# Patient Record
Sex: Female | Born: 1985
Health system: Southern US, Community
[De-identification: ages and names within clinical notes are randomized; demographics above are authoritative.]

## PROBLEM LIST (undated history)

## (undated) ENCOUNTER — Inpatient Hospital Stay (HOSPITAL_COMMUNITY): Payer: Self-pay

## (undated) DIAGNOSIS — L409 Psoriasis, unspecified: Secondary | ICD-10-CM

## (undated) DIAGNOSIS — Z309 Encounter for contraceptive management, unspecified: Secondary | ICD-10-CM

## (undated) DIAGNOSIS — L309 Dermatitis, unspecified: Secondary | ICD-10-CM

## (undated) DIAGNOSIS — R87629 Unspecified abnormal cytological findings in specimens from vagina: Secondary | ICD-10-CM

## (undated) DIAGNOSIS — N760 Acute vaginitis: Secondary | ICD-10-CM

## (undated) DIAGNOSIS — Z113 Encounter for screening for infections with a predominantly sexual mode of transmission: Secondary | ICD-10-CM

## (undated) DIAGNOSIS — IMO0002 Reserved for concepts with insufficient information to code with codable children: Secondary | ICD-10-CM

## (undated) DIAGNOSIS — R87619 Unspecified abnormal cytological findings in specimens from cervix uteri: Secondary | ICD-10-CM

## (undated) DIAGNOSIS — R51 Headache: Secondary | ICD-10-CM

## (undated) DIAGNOSIS — N898 Other specified noninflammatory disorders of vagina: Secondary | ICD-10-CM

## (undated) DIAGNOSIS — B9689 Other specified bacterial agents as the cause of diseases classified elsewhere: Secondary | ICD-10-CM

## (undated) HISTORY — DX: Encounter for contraceptive management, unspecified: Z30.9

## (undated) HISTORY — PX: NO PAST SURGERIES: SHX2092

## (undated) HISTORY — DX: Other specified noninflammatory disorders of vagina: N89.8

## (undated) HISTORY — DX: Encounter for screening for infections with a predominantly sexual mode of transmission: Z11.3

## (undated) HISTORY — DX: Psoriasis, unspecified: L40.9

## (undated) HISTORY — DX: Dermatitis, unspecified: L30.9

## (undated) HISTORY — DX: Acute vaginitis: N76.0

## (undated) HISTORY — PX: COLPOSCOPY W/ BIOPSY / CURETTAGE: SUR283

## (undated) HISTORY — DX: Other specified bacterial agents as the cause of diseases classified elsewhere: B96.89

## (undated) HISTORY — DX: Headache: R51

## (undated) HISTORY — DX: Unspecified abnormal cytological findings in specimens from vagina: R87.629

---

## 2008-07-26 ENCOUNTER — Emergency Department (HOSPITAL_COMMUNITY): Admission: EM | Admit: 2008-07-26 | Discharge: 2008-07-26 | Payer: Self-pay | Admitting: Emergency Medicine

## 2009-02-28 ENCOUNTER — Emergency Department (HOSPITAL_COMMUNITY): Admission: EM | Admit: 2009-02-28 | Discharge: 2009-02-28 | Payer: Self-pay | Admitting: Emergency Medicine

## 2010-09-24 NOTE — Unmapped (Signed)
THE [facility]     PATIENT NAME:   Autumn Shepard, Autumn Shepard                 MR #:  [id number]   DATE OF BIRTH:  04-19-1986                        ACCOUNT #:  [id number]   ED PHYSICIAN:   Clementeen Hoof. Lowella Petties, M.D.          ROOM #:   PRIMARY:        Selected Referral Pt              NURSING UNIT:  ED   REFERRING:      Selected Referral Pt              FC:  D   DICTATED BY:    Clementeen Hoof. Lowella Petties, M.D.          ADMIT DATE:  09/24/2010   VISIT DATE:                                       DISCHARGE DATE:                               EMERGENCY DEPARTMENT NOTE     *-*-*     CHIEF COMPLAINT:  Tooth pain.     HISTORY OF PRESENT ILLNESS:  This is a 25 year old female who about a month   ago had wisdom teeth extracted in all four quadrants.  Has had no malodorous   taste in her mouth.  Has had persistent pain, worse in the left greater than   the right.  Her swelling is altogether resolved.  She has followed up with   her oral surgeon that extracted her teeth and was told that everything   appeared to be healing well.  She endorses continued pain.  She comes back in   because she was told that we have a dentist here at [facility].   Notes no fevers or chills, no inability to swallow secretions.  She has been   taking a lot of ibuprofen without relief.  She does endorse chronic low back   pain for which she has a pain patch.  She notes 10/10 pain in the left   greater than right mandibular region.     PAST MEDICAL HISTORY:     1.  Chronic back pain.   2.  Asthma.     MEDICATIONS:     1.  Paxil.   2.  Ibuprofen.   3.  Pain patch that she wears topically for her back pain.     REVIEW OF SYSTEMS:  No intentional weight loss, weight gain.  Positive for   back pain.     SOCIAL HISTORY:  She smokes one pack per day.  Does not drink or use any   other drugs.     FAMILY HISTORY:  Positive for hypertension.     PHYSICAL EXAMINATION:     VITAL SIGNS:  Blood pressure 122/86, heart rate 99, respiration  16,   temperature is 96.9, sat is 100% on room air.   GENERAL:  The patient is in no acute distress.   HEENT:  Normocephalic and atraumatic.  Pupils were equal, round, and reactive   to light.  Extraocular motion is intact.  Oropharynx is pink.  Mucous   membranes are moist.  Airway is widely patent.  Uvula is midline.  Tongue is   midline.  There is no submental swelling.  There is no evidence of purulent   material draining from any of the four extraction sites.  Floor of mouth is   soft.  No erythema is noted or swelling is noted past the angle of the   mandible.  TMs are clear.   NECK:  No stridor, no JVD, no bruit, no thyromegaly, and no nuchal rigidity.   CHEST:  Clear to auscultation and percussion.   HEART:  Regular rate and rhythm.   ABDOMEN:  Bowel sounds are positive.  Nontender and nondistended.  No   hepatosplenomegaly.   MUSCULOSKELETAL:  Joints have full range of motion.  There is no clubbing,   cyanosis, or edema.   LYMPHATIC EXAM:  No axillary or cervical lymphadenopathy.   SKIN EXAM:  Shows no petechial or purpuric rashes.   PSYCHIATRIC EXAM:  The patient is alert and oriented.   NEUROLOGIC:  Strength 5/5.     ASSESSMENT:     1.  Acute odontalgia, consider subclinical periostitis.     PLAN:     1.  The patient was given Penicillin VK and Ultram.   2.  Instructed to follow up with any of the local dental clinics, although     her options are limited given the fact that she is from [place].   3.  I have report her to go back to her oral surgeon to further explore the     etiology of her discomfort.     DISPOSITION:  She is discharged home.     CONDITION ON DISCHARGE:  Good.           *-*-*                                             _______________________________________   GJF/krm                                _____   D:  09/24/2010 07:42                  Clementeen Hoof. Lowella Petties, M.D.   T:  09/24/2010 14:30   Job #:  [id number]                                    EMERGENCY DEPARTMENT NOTE                                                                 PAGE    1 of   1

## 2012-02-12 LAB — OB RESULTS CONSOLE RUBELLA ANTIBODY, IGM: Rubella: IMMUNE

## 2012-02-12 LAB — OB RESULTS CONSOLE HIV ANTIBODY (ROUTINE TESTING): HIV: NONREACTIVE

## 2012-02-12 LAB — OB RESULTS CONSOLE VARICELLA ZOSTER ANTIBODY, IGG: Varicella: IMMUNE

## 2012-06-09 NOTE — L&D Delivery Note (Signed)
Delivery Note At 3:39 PM a viable and healthy female was delivered via Vaginal, Spontaneous Delivery (Presentation: Left Occiput Anterior).  APGAR: 8&9, weight: pending .   Placenta status: Intact, Spontaneous.  Cord: 3 vessels with the following complications: None.  Cord pH: n/a  Anesthesia: Epidural  Episiotomy: None Lacerations: None Est. Blood Loss (mL): 350  Mom to postpartum.  Baby to nursery-stable.  [facility] 09/08/2012, 4:13 PM

## 2012-06-11 LAB — OB RESULTS CONSOLE RPR: RPR: NONREACTIVE

## 2012-08-21 ENCOUNTER — Encounter: Payer: Self-pay | Admitting: *Deleted

## 2012-08-21 DIAGNOSIS — L409 Psoriasis, unspecified: Secondary | ICD-10-CM

## 2012-08-21 DIAGNOSIS — G43109 Migraine with aura, not intractable, without status migrainosus: Secondary | ICD-10-CM

## 2012-08-26 ENCOUNTER — Encounter: Payer: Self-pay | Admitting: Obstetrics & Gynecology

## 2012-08-26 ENCOUNTER — Ambulatory Visit (INDEPENDENT_AMBULATORY_CARE_PROVIDER_SITE_OTHER): Payer: Medicaid Other | Admitting: Obstetrics & Gynecology

## 2012-08-26 VITALS — BP 120/80 | Wt 183.5 lb

## 2012-08-26 DIAGNOSIS — Z3403 Encounter for supervision of normal first pregnancy, third trimester: Secondary | ICD-10-CM

## 2012-08-26 DIAGNOSIS — O289 Unspecified abnormal findings on antenatal screening of mother: Secondary | ICD-10-CM

## 2012-08-26 DIAGNOSIS — O283 Abnormal ultrasonic finding on antenatal screening of mother: Secondary | ICD-10-CM

## 2012-08-26 DIAGNOSIS — O358XX0 Maternal care for other (suspected) fetal abnormality and damage, not applicable or unspecified: Secondary | ICD-10-CM

## 2012-08-26 DIAGNOSIS — Z1389 Encounter for screening for other disorder: Secondary | ICD-10-CM

## 2012-08-26 DIAGNOSIS — Z331 Pregnant state, incidental: Secondary | ICD-10-CM

## 2012-08-26 LAB — POCT URINALYSIS DIPSTICK
Blood, UA: NEGATIVE
Glucose, UA: NEGATIVE
Nitrite, UA: NEGATIVE

## 2012-08-26 NOTE — Progress Notes (Signed)
Patient reports good fetal movement, denies any bleeding and no rupture of membranes symptoms or contraction.  She is without complaints.  All questions were answered.

## 2012-08-26 NOTE — Patient Instructions (Signed)
Pregnancy - Third Trimester  The third trimester of pregnancy (the last 3 months) is a period of the most rapid growth for you and your baby. The baby approaches a length of 20 inches and a weight of 6 to 10 pounds. The baby is adding on fat and getting ready for life outside your body. While inside, babies have periods of sleeping and waking, suck their thumbs, and hiccups. You can often feel small contractions of the uterus. This is false labor. It is also called Braxton-Hicks contractions. This is like a practice for labor. The usual problems in this stage of pregnancy include more difficulty breathing, swelling of the hands and feet from water retention, and having to urinate more often because of the uterus and baby pressing on your bladder.   PRENATAL EXAMS  · Blood work may continue to be done during prenatal exams. These tests are done to check on your health and the probable health of your baby. Blood work is used to follow your blood levels (hemoglobin). Anemia (low hemoglobin) is common during pregnancy. Iron and vitamins are given to help prevent this. You may also continue to be checked for diabetes. Some of the past blood tests may be done again.  · The size of the uterus is measured during each visit. This makes sure your baby is growing properly according to your pregnancy dates.  · Your blood pressure is checked every prenatal visit. This is to make sure you are not getting toxemia.  · Your urine is checked every prenatal visit for infection, diabetes and protein.  · Your weight is checked at each visit. This is done to make sure gains are happening at the suggested rate and that you and your baby are growing normally.  · Sometimes, an ultrasound is performed to confirm the position and the proper growth and development of the baby. This is a test done that bounces harmless sound waves off the baby so your caregiver can more accurately determine due dates.  · Discuss the type of pain medication and  anesthesia you will have during your labor and delivery.  · Discuss the possibility and anesthesia if a Cesarean Section might be necessary.  · Inform your caregiver if there is any mental or physical violence at home.  Sometimes, a specialized non-stress test, contraction stress test and biophysical profile are done to make sure the baby is not having a problem. Checking the amniotic fluid surrounding the baby is called an amniocentesis. The amniotic fluid is removed by sticking a needle into the belly (abdomen). This is sometimes done near the end of pregnancy if an early delivery is required. In this case, it is done to help make sure the baby's lungs are mature enough for the baby to live outside of the womb. If the lungs are not mature and it is unsafe to deliver the baby, an injection of cortisone medication is given to the mother 1 to 2 days before the delivery. This helps the baby's lungs mature and makes it safer to deliver the baby.  CHANGES OCCURING IN THE THIRD TRIMESTER OF PREGNANCY  Your body goes through many changes during pregnancy. They vary from person to person. Talk to your caregiver about changes you notice and are concerned about.  · During the last trimester, you have probably had an increase in your appetite. It is normal to have cravings for certain foods. This varies from person to person and pregnancy to pregnancy.  · You may begin to   get stretch marks on your hips, abdomen, and breasts. These are normal changes in the body during pregnancy. There are no exercises or medications to take which prevent this change.  · Constipation may be treated with a stool softener or adding bulk to your diet. Drinking lots of fluids, fiber in vegetables, fruits, and whole grains are helpful.  · Exercising is also helpful. If you have been very active up until your pregnancy, most of these activities can be continued during your pregnancy. If you have been less active, it is helpful to start an exercise  program such as walking. Consult your caregiver before starting exercise programs.  · Avoid all smoking, alcohol, un-prescribed drugs, herbs and "street drugs" during your pregnancy. These chemicals affect the formation and growth of the baby. Avoid chemicals throughout the pregnancy to ensure the delivery of a healthy infant.  · Backache, varicose veins and hemorrhoids may develop or get worse.  · You will tire more easily in the third trimester, which is normal.  · The baby's movements may be stronger and more often.  · You may become short of breath easily.  · Your belly button may stick out.  · A yellow discharge may leak from your breasts called colostrum.  · You may have a bloody mucus discharge. This usually occurs a few days to a week before labor begins.  HOME CARE INSTRUCTIONS   · Keep your caregiver's appointments. Follow your caregiver's instructions regarding medication use, exercise, and diet.  · During pregnancy, you are providing food for you and your baby. Continue to eat regular, well-balanced meals. Choose foods such as meat, fish, milk and other low fat dairy products, vegetables, fruits, and whole-grain breads and cereals. Your caregiver will tell you of the ideal weight gain.  · A physical sexual relationship may be continued throughout pregnancy if there are no other problems such as early (premature) leaking of amniotic fluid from the membranes, vaginal bleeding, or belly (abdominal) pain.  · Exercise regularly if there are no restrictions. Check with your caregiver if you are unsure of the safety of your exercises. Greater weight gain will occur in the last 2 trimesters of pregnancy. Exercising helps:  · Control your weight.  · Get you in shape for labor and delivery.  · You lose weight after you deliver.  · Rest a lot with legs elevated, or as needed for leg cramps or low back pain.  · Wear a good support or jogging bra for breast tenderness during pregnancy. This may help if worn during  sleep. Pads or tissues may be used in the bra if you are leaking colostrum.  · Do not use hot tubs, steam rooms, or saunas.  · Wear your seat belt when driving. This protects you and your baby if you are in an accident.  · Avoid raw meat, cat litter boxes and soil used by cats. These carry germs that can cause birth defects in the baby.  · It is easier to loose urine during pregnancy. Tightening up and strengthening the pelvic muscles will help with this problem. You can practice stopping your urination while you are going to the bathroom. These are the same muscles you need to strengthen. It is also the muscles you would use if you were trying to stop from passing gas. You can practice tightening these muscles up 10 times a set and repeating this about 3 times per day. Once you know what muscles to tighten up, do not perform these   exercises during urination. It is more likely to cause an infection by backing up the urine.  · Ask for help if you have financial, counseling or nutritional needs during pregnancy. Your caregiver will be able to offer counseling for these needs as well as refer you for other special needs.  · Make a list of emergency phone numbers and have them available.  · Plan on getting help from family or friends when you go home from the hospital.  · Make a trial run to the hospital.  · Take prenatal classes with the father to understand, practice and ask questions about the labor and delivery.  · Prepare the baby's room/nursery.  · Do not travel out of the city unless it is absolutely necessary and with the advice of your caregiver.  · Wear only low or no heal shoes to have better balance and prevent falling.  MEDICATIONS AND DRUG USE IN PREGNANCY  · Take prenatal vitamins as directed. The vitamin should contain 1 milligram of folic acid. Keep all vitamins out of reach of children. Only a couple vitamins or tablets containing iron may be fatal to a baby or young child when ingested.  · Avoid use  of all medications, including herbs, over-the-counter medications, not prescribed or suggested by your caregiver. Only take over-the-counter or prescription medicines for pain, discomfort, or fever as directed by your caregiver. Do not use aspirin, ibuprofen (Motrin®, Advil®, Nuprin®) or naproxen (Aleve®) unless OK'd by your caregiver.  · Let your caregiver also know about herbs you may be using.  · Alcohol is related to a number of birth defects. This includes fetal alcohol syndrome. All alcohol, in any form, should be avoided completely. Smoking will cause low birth rate and premature babies.  · Street/illegal drugs are very harmful to the baby. They are absolutely forbidden. A baby born to an addicted mother will be addicted at birth. The baby will go through the same withdrawal an adult does.  SEEK MEDICAL CARE IF:  You have any concerns or worries during your pregnancy. It is better to call with your questions if you feel they cannot wait, rather than worry about them.  DECISIONS ABOUT CIRCUMCISION  You may or may not know the sex of your baby. If you know your baby is a boy, it may be time to think about circumcision. Circumcision is the removal of the foreskin of the penis. This is the skin that covers the sensitive end of the penis. There is no proven medical need for this. Often this decision is made on what is popular at the time or based upon religious beliefs and social issues. You can discuss these issues with your caregiver or pediatrician.  SEEK IMMEDIATE MEDICAL CARE IF:   · An unexplained oral temperature above 102° F (38.9° C) develops, or as your caregiver suggests.  · You have leaking of fluid from the vagina (birth canal). If leaking membranes are suspected, take your temperature and tell your caregiver of this when you call.  · There is vaginal spotting, bleeding or passing clots. Tell your caregiver of the amount and how many pads are used.  · You develop a bad smelling vaginal discharge with  a change in the color from clear to white.  · You develop vomiting that lasts more than 24 hours.  · You develop chills or fever.  · You develop shortness of breath.  · You develop burning on urination.  · You loose more than 2 pounds of weight   or gain more than 2 pounds of weight or as suggested by your caregiver.  · You notice sudden swelling of your face, hands, and feet or legs.  · You develop belly (abdominal) pain. Round ligament discomfort is a common non-cancerous (benign) cause of abdominal pain in pregnancy. Your caregiver still must evaluate you.  · You develop a severe headache that does not go away.  · You develop visual problems, blurred or double vision.  · If you have not felt your baby move for more than 1 hour. If you think the baby is not moving as much as usual, eat something with sugar in it and lie down on your left side for an hour. The baby should move at least 4 to 5 times per hour. Call right away if your baby moves less than that.  · You fall, are in a car accident or any kind of trauma.  · There is mental or physical violence at home.  Document Released: 05/20/2001 Document Revised: 08/18/2011 Document Reviewed: 11/22/2008  Exit[employer]® Patient Information ©2013 [employer].

## 2012-08-27 LAB — OB RESULTS CONSOLE GBS: GBS: POSITIVE

## 2012-09-02 ENCOUNTER — Encounter: Payer: Self-pay | Admitting: Obstetrics & Gynecology

## 2012-09-02 ENCOUNTER — Ambulatory Visit (INDEPENDENT_AMBULATORY_CARE_PROVIDER_SITE_OTHER): Payer: Medicaid Other | Admitting: Obstetrics & Gynecology

## 2012-09-02 VITALS — BP 128/80 | Wt 182.0 lb

## 2012-09-02 DIAGNOSIS — Z3400 Encounter for supervision of normal first pregnancy, unspecified trimester: Secondary | ICD-10-CM | POA: Insufficient documentation

## 2012-09-02 DIAGNOSIS — O289 Unspecified abnormal findings on antenatal screening of mother: Secondary | ICD-10-CM

## 2012-09-02 DIAGNOSIS — Z3403 Encounter for supervision of normal first pregnancy, third trimester: Secondary | ICD-10-CM

## 2012-09-02 DIAGNOSIS — O358XX0 Maternal care for other (suspected) fetal abnormality and damage, not applicable or unspecified: Secondary | ICD-10-CM

## 2012-09-02 DIAGNOSIS — Z331 Pregnant state, incidental: Secondary | ICD-10-CM

## 2012-09-02 DIAGNOSIS — Z1389 Encounter for screening for other disorder: Secondary | ICD-10-CM

## 2012-09-02 LAB — POCT URINALYSIS DIPSTICK
Glucose, UA: NEGATIVE
Ketones, UA: NEGATIVE
Leukocytes, UA: NEGATIVE

## 2012-09-02 NOTE — Progress Notes (Signed)
Patient reports good fetal movement, denies any bleeding and no rupture of membranes symptoms or contraction No edema and abdomen benign Routine care fu 1 week

## 2012-09-02 NOTE — Progress Notes (Signed)
Having some cramping and backpain.

## 2012-09-02 NOTE — Patient Instructions (Signed)
Pregnancy - Third Trimester  The third trimester of pregnancy (the last 3 months) is a period of the most rapid growth for you and your baby. The baby approaches a length of 20 inches and a weight of 6 to 10 pounds. The baby is adding on fat and getting ready for life outside your body. While inside, babies have periods of sleeping and waking, suck their thumbs, and hiccups. You can often feel small contractions of the uterus. This is false labor. It is also called Braxton-Hicks contractions. This is like a practice for labor. The usual problems in this stage of pregnancy include more difficulty breathing, swelling of the hands and feet from water retention, and having to urinate more often because of the uterus and baby pressing on your bladder.   PRENATAL EXAMS  · Blood work may continue to be done during prenatal exams. These tests are done to check on your health and the probable health of your baby. Blood work is used to follow your blood levels (hemoglobin). Anemia (low hemoglobin) is common during pregnancy. Iron and vitamins are given to help prevent this. You may also continue to be checked for diabetes. Some of the past blood tests may be done again.  · The size of the uterus is measured during each visit. This makes sure your baby is growing properly according to your pregnancy dates.  · Your blood pressure is checked every prenatal visit. This is to make sure you are not getting toxemia.  · Your urine is checked every prenatal visit for infection, diabetes and protein.  · Your weight is checked at each visit. This is done to make sure gains are happening at the suggested rate and that you and your baby are growing normally.  · Sometimes, an ultrasound is performed to confirm the position and the proper growth and development of the baby. This is a test done that bounces harmless sound waves off the baby so your caregiver can more accurately determine due dates.  · Discuss the type of pain medication and  anesthesia you will have during your labor and delivery.  · Discuss the possibility and anesthesia if a Cesarean Section might be necessary.  · Inform your caregiver if there is any mental or physical violence at home.  Sometimes, a specialized non-stress test, contraction stress test and biophysical profile are done to make sure the baby is not having a problem. Checking the amniotic fluid surrounding the baby is called an amniocentesis. The amniotic fluid is removed by sticking a needle into the belly (abdomen). This is sometimes done near the end of pregnancy if an early delivery is required. In this case, it is done to help make sure the baby's lungs are mature enough for the baby to live outside of the womb. If the lungs are not mature and it is unsafe to deliver the baby, an injection of cortisone medication is given to the mother 1 to 2 days before the delivery. This helps the baby's lungs mature and makes it safer to deliver the baby.  CHANGES OCCURING IN THE THIRD TRIMESTER OF PREGNANCY  Your body goes through many changes during pregnancy. They vary from person to person. Talk to your caregiver about changes you notice and are concerned about.  · During the last trimester, you have probably had an increase in your appetite. It is normal to have cravings for certain foods. This varies from person to person and pregnancy to pregnancy.  · You may begin to   get stretch marks on your hips, abdomen, and breasts. These are normal changes in the body during pregnancy. There are no exercises or medications to take which prevent this change.  · Constipation may be treated with a stool softener or adding bulk to your diet. Drinking lots of fluids, fiber in vegetables, fruits, and whole grains are helpful.  · Exercising is also helpful. If you have been very active up until your pregnancy, most of these activities can be continued during your pregnancy. If you have been less active, it is helpful to start an exercise  program such as walking. Consult your caregiver before starting exercise programs.  · Avoid all smoking, alcohol, un-prescribed drugs, herbs and "street drugs" during your pregnancy. These chemicals affect the formation and growth of the baby. Avoid chemicals throughout the pregnancy to ensure the delivery of a healthy infant.  · Backache, varicose veins and hemorrhoids may develop or get worse.  · You will tire more easily in the third trimester, which is normal.  · The baby's movements may be stronger and more often.  · You may become short of breath easily.  · Your belly button may stick out.  · A yellow discharge may leak from your breasts called colostrum.  · You may have a bloody mucus discharge. This usually occurs a few days to a week before labor begins.  HOME CARE INSTRUCTIONS   · Keep your caregiver's appointments. Follow your caregiver's instructions regarding medication use, exercise, and diet.  · During pregnancy, you are providing food for you and your baby. Continue to eat regular, well-balanced meals. Choose foods such as meat, fish, milk and other low fat dairy products, vegetables, fruits, and whole-grain breads and cereals. Your caregiver will tell you of the ideal weight gain.  · A physical sexual relationship may be continued throughout pregnancy if there are no other problems such as early (premature) leaking of amniotic fluid from the membranes, vaginal bleeding, or belly (abdominal) pain.  · Exercise regularly if there are no restrictions. Check with your caregiver if you are unsure of the safety of your exercises. Greater weight gain will occur in the last 2 trimesters of pregnancy. Exercising helps:  · Control your weight.  · Get you in shape for labor and delivery.  · You lose weight after you deliver.  · Rest a lot with legs elevated, or as needed for leg cramps or low back pain.  · Wear a good support or jogging bra for breast tenderness during pregnancy. This may help if worn during  sleep. Pads or tissues may be used in the bra if you are leaking colostrum.  · Do not use hot tubs, steam rooms, or saunas.  · Wear your seat belt when driving. This protects you and your baby if you are in an accident.  · Avoid raw meat, cat litter boxes and soil used by cats. These carry germs that can cause birth defects in the baby.  · It is easier to loose urine during pregnancy. Tightening up and strengthening the pelvic muscles will help with this problem. You can practice stopping your urination while you are going to the bathroom. These are the same muscles you need to strengthen. It is also the muscles you would use if you were trying to stop from passing gas. You can practice tightening these muscles up 10 times a set and repeating this about 3 times per day. Once you know what muscles to tighten up, do not perform these   exercises during urination. It is more likely to cause an infection by backing up the urine.  · Ask for help if you have financial, counseling or nutritional needs during pregnancy. Your caregiver will be able to offer counseling for these needs as well as refer you for other special needs.  · Make a list of emergency phone numbers and have them available.  · Plan on getting help from family or friends when you go home from the hospital.  · Make a trial run to the hospital.  · Take prenatal classes with the father to understand, practice and ask questions about the labor and delivery.  · Prepare the baby's room/nursery.  · Do not travel out of the city unless it is absolutely necessary and with the advice of your caregiver.  · Wear only low or no heal shoes to have better balance and prevent falling.  MEDICATIONS AND DRUG USE IN PREGNANCY  · Take prenatal vitamins as directed. The vitamin should contain 1 milligram of folic acid. Keep all vitamins out of reach of children. Only a couple vitamins or tablets containing iron may be fatal to a baby or young child when ingested.  · Avoid use  of all medications, including herbs, over-the-counter medications, not prescribed or suggested by your caregiver. Only take over-the-counter or prescription medicines for pain, discomfort, or fever as directed by your caregiver. Do not use aspirin, ibuprofen (Motrin®, Advil®, Nuprin®) or naproxen (Aleve®) unless OK'd by your caregiver.  · Let your caregiver also know about herbs you may be using.  · Alcohol is related to a number of birth defects. This includes fetal alcohol syndrome. All alcohol, in any form, should be avoided completely. Smoking will cause low birth rate and premature babies.  · Street/illegal drugs are very harmful to the baby. They are absolutely forbidden. A baby born to an addicted mother will be addicted at birth. The baby will go through the same withdrawal an adult does.  SEEK MEDICAL CARE IF:  You have any concerns or worries during your pregnancy. It is better to call with your questions if you feel they cannot wait, rather than worry about them.  DECISIONS ABOUT CIRCUMCISION  You may or may not know the sex of your baby. If you know your baby is a boy, it may be time to think about circumcision. Circumcision is the removal of the foreskin of the penis. This is the skin that covers the sensitive end of the penis. There is no proven medical need for this. Often this decision is made on what is popular at the time or based upon religious beliefs and social issues. You can discuss these issues with your caregiver or pediatrician.  SEEK IMMEDIATE MEDICAL CARE IF:   · An unexplained oral temperature above 102° F (38.9° C) develops, or as your caregiver suggests.  · You have leaking of fluid from the vagina (birth canal). If leaking membranes are suspected, take your temperature and tell your caregiver of this when you call.  · There is vaginal spotting, bleeding or passing clots. Tell your caregiver of the amount and how many pads are used.  · You develop a bad smelling vaginal discharge with  a change in the color from clear to white.  · You develop vomiting that lasts more than 24 hours.  · You develop chills or fever.  · You develop shortness of breath.  · You develop burning on urination.  · You loose more than 2 pounds of weight   or gain more than 2 pounds of weight or as suggested by your caregiver.  · You notice sudden swelling of your face, hands, and feet or legs.  · You develop belly (abdominal) pain. Round ligament discomfort is a common non-cancerous (benign) cause of abdominal pain in pregnancy. Your caregiver still must evaluate you.  · You develop a severe headache that does not go away.  · You develop visual problems, blurred or double vision.  · If you have not felt your baby move for more than 1 hour. If you think the baby is not moving as much as usual, eat something with sugar in it and lie down on your left side for an hour. The baby should move at least 4 to 5 times per hour. Call right away if your baby moves less than that.  · You fall, are in a car accident or any kind of trauma.  · There is mental or physical violence at home.  Document Released: 05/20/2001 Document Revised: 08/18/2011 Document Reviewed: 11/22/2008  Exit[employer]® Patient Information ©2013 [employer].

## 2012-09-05 ENCOUNTER — Inpatient Hospital Stay (HOSPITAL_COMMUNITY)
Admission: AD | Admit: 2012-09-05 | Discharge: 2012-09-05 | Disposition: A | Discharge status: Hospice | Payer: Medicaid Other | Source: Ambulatory Visit | Attending: Obstetrics & Gynecology | Admitting: Obstetrics & Gynecology

## 2012-09-05 ENCOUNTER — Encounter (HOSPITAL_COMMUNITY): Payer: Self-pay | Admitting: *Deleted

## 2012-09-05 DIAGNOSIS — O479 False labor, unspecified: Secondary | ICD-10-CM | POA: Insufficient documentation

## 2012-09-05 DIAGNOSIS — O99891 Other specified diseases and conditions complicating pregnancy: Secondary | ICD-10-CM | POA: Insufficient documentation

## 2012-09-05 HISTORY — DX: Unspecified abnormal cytological findings in specimens from cervix uteri: R87.619

## 2012-09-05 HISTORY — DX: Reserved for concepts with insufficient information to code with codable children: IMO0002

## 2012-09-05 LAB — POCT FERN TEST: POCT Fern Test: NEGATIVE

## 2012-09-05 NOTE — MAU Note (Signed)
Pt G1 at 38.4wks having contractions every 10-5min.  Leaking a white watery fluid x 2 days.

## 2012-09-05 NOTE — MAU Provider Note (Signed)
History     CSN: [id number]  Arrival date and time: 09/05/12 2052   None     Chief Complaint  Patient presents with  . Contractions   HPI 27 y.o. G1P0 at [redacted]w[redacted]d with leak of white watery discharge.  Had yeast infection last week and treated. No bleeding. No dysuria or vaginal itching. No odor. Did have intercourse earlier today. Mild crampy abdominal pain but not really feeling contractions. Baby moving well.   Gets care at [facility]. No complications this pregnancy. Dating by LMP and 9 week sono.  OB History   Grav Para Term Preterm Abortions TAB SAB Ect Mult Living   1               Past Medical History  Diagnosis Date  . Headache     migraines with aura  . Psoriasis   . Abnormal Pap smear     Past Surgical History  Procedure Laterality Date  . No past surgeries    . Colposcopy w/ biopsy / curettage      Family History  Problem Relation Age of Onset  . Cancer Other     stomach  . Diabetes Other   . Hypertension Other   . Coronary artery disease Other   . Multiple sclerosis Other   . Bipolar disorder Other     History  Substance Use Topics  . Smoking status: Never Smoker   . Smokeless tobacco: Not on file  . Alcohol Use: No    Allergies:  Allergies  Allergen Reactions  . Ciprocinonide (Fluocinolone) Anaphylaxis, Hives and Itching    Prescriptions prior to admission  Medication Sig Dispense Refill  . Prenatal Vit-Fe Fumarate-FA (PRENATAL MULTIVITAMIN) TABS Take 1 tablet by mouth daily at 12 noon.        Review of Systems  Constitutional: Negative for fever and chills.  Eyes: Negative for blurred vision and double vision.  Gastrointestinal: Negative for nausea and vomiting.  Genitourinary: Negative for dysuria.  Neurological: Negative for headaches.   Physical Exam   Blood pressure 122/79, pulse 98, temperature 98.3 F (36.8 C), temperature source Oral, resp. rate 16, height 5\' 7"  (1.702 m), weight 82.01 kg (180 lb 12.8 oz), last menstrual  period 12/10/2011.  Physical Exam  Constitutional: She is oriented to person, place, and time. She appears well-developed and well-nourished. No distress.  HENT:  Head: Normocephalic and atraumatic.  Eyes: Conjunctivae and EOM are normal.  Neck: Normal range of motion. Neck supple.  Cardiovascular: Normal rate, regular rhythm and normal heart sounds.   Respiratory: Effort normal and breath sounds normal. No respiratory distress.  GI: Soft. There is no tenderness. There is no rebound and no guarding.  Gravid - size appropriate for dates  Genitourinary:  Normal external genitalia. Normal vagina. Clear/yellow mucous discharge coming from cervix. No pooling. Visually closed.  Musculoskeletal: Normal range of motion. She exhibits no edema and no tenderness.  Neurological: She is alert and oriented to person, place, and time.  Skin: Skin is warm and dry.  Psychiatric: She has a normal mood and affect.   Results for orders placed during the hospital encounter of 09/05/12 (from the past 24 hour(s))  POCT FERN TEST     Status: None   Collection Time    09/05/12 10:43 PM      Result Value Range   POCT Fern Test Negative = intact amniotic membranes      MAU Course  Procedures  FHTs:  130, mod variability, accels present,  no decels CTX:  q 2-3 min  Dilation: 1.5 Effacement (%): 80 Station: -2 Presentation: Vertex Exam by:: [employer] of cervix after 1 hour:  Unchanged.  Assessment and Plan  27 y.o. G1P0 at [redacted]w[redacted]d with possible leak of fluid:   - Fern negative, no pooling - NST reactive - frequent contractions on monitor but no cervical change, pt not uncomfortable - Reviewed labor signs/symptoms - discharge home   Napoleon Form 09/05/2012, 11:24 PM

## 2012-09-08 ENCOUNTER — Inpatient Hospital Stay (HOSPITAL_COMMUNITY)
Admission: AD | Admit: 2012-09-08 | Discharge: 2012-09-10 | DRG: 775 | Disposition: A | Discharge status: Home / Self Care | Payer: Managed Care, Other (non HMO) | Source: Ambulatory Visit | Attending: Obstetrics & Gynecology | Admitting: Obstetrics & Gynecology

## 2012-09-08 ENCOUNTER — Inpatient Hospital Stay (HOSPITAL_COMMUNITY): Payer: Managed Care, Other (non HMO) | Admitting: Anesthesiology

## 2012-09-08 ENCOUNTER — Encounter (HOSPITAL_COMMUNITY): Payer: Self-pay | Admitting: Anesthesiology

## 2012-09-08 ENCOUNTER — Encounter (HOSPITAL_COMMUNITY): Payer: Self-pay | Admitting: *Deleted

## 2012-09-08 DIAGNOSIS — O283 Abnormal ultrasonic finding on antenatal screening of mother: Secondary | ICD-10-CM

## 2012-09-08 DIAGNOSIS — O99892 Other specified diseases and conditions complicating childbirth: Secondary | ICD-10-CM

## 2012-09-08 DIAGNOSIS — Z3403 Encounter for supervision of normal first pregnancy, third trimester: Secondary | ICD-10-CM

## 2012-09-08 DIAGNOSIS — Z22330 Carrier of Group B streptococcus: Secondary | ICD-10-CM

## 2012-09-08 DIAGNOSIS — O9989 Other specified diseases and conditions complicating pregnancy, childbirth and the puerperium: Secondary | ICD-10-CM

## 2012-09-08 LAB — CBC
HCT: 39.9 % (ref 36.0–46.0)
Hemoglobin: 14.2 g/dL (ref 12.0–15.0)
MCH: 31.8 pg (ref 26.0–34.0)
MCHC: 35.6 g/dL (ref 30.0–36.0)
MCV: 89.5 fL (ref 78.0–100.0)
RDW: 13.1 % (ref 11.5–15.5)

## 2012-09-08 MED ORDER — ACETAMINOPHEN 325 MG PO TABS: 650.0000 mg | ORAL_TABLET | ORAL | Status: DC | PRN

## 2012-09-08 MED ORDER — SIMETHICONE 80 MG PO CHEW: 80.0000 mg | CHEWABLE_TABLET | ORAL | Status: DC | PRN

## 2012-09-08 MED ORDER — LIDOCAINE HCL (PF) 1 % IJ SOLN
30.0000 mL | INTRAMUSCULAR | Status: DC | PRN
  Filled 2012-09-08 (×2): qty 30

## 2012-09-08 MED ORDER — LACTATED RINGERS IV SOLN
INTRAVENOUS | Status: DC
  Administered 2012-09-08 (×2): via INTRAVENOUS

## 2012-09-08 MED ORDER — IBUPROFEN 600 MG PO TABS: 600.0000 mg | ORAL_TABLET | Freq: Four times a day (QID) | ORAL | Status: DC | PRN

## 2012-09-08 MED ORDER — SENNOSIDES-DOCUSATE SODIUM 8.6-50 MG PO TABS
2.0000 | ORAL_TABLET | Freq: Every day | ORAL | Status: DC
  Administered 2012-09-08 – 2012-09-09 (×2): 2 via ORAL

## 2012-09-08 MED ORDER — PRENATAL MULTIVITAMIN CH
1.0000 | ORAL_TABLET | Freq: Every day | ORAL | Status: DC
  Administered 2012-09-09: 1 via ORAL
  Filled 2012-09-08: qty 1

## 2012-09-08 MED ORDER — SODIUM CHLORIDE 0.9 % IV SOLN
2.0000 g | Freq: Once | INTRAVENOUS | Status: AC
  Administered 2012-09-08: 2 g via INTRAVENOUS
  Filled 2012-09-08: qty 2000

## 2012-09-08 MED ORDER — SODIUM BICARBONATE 8.4 % IV SOLN
INTRAVENOUS | Status: DC | PRN
  Administered 2012-09-08: 5 mL via EPIDURAL

## 2012-09-08 MED ORDER — EPHEDRINE 5 MG/ML INJ
10.0000 mg | INTRAVENOUS | Status: DC | PRN
  Filled 2012-09-08: qty 4
  Filled 2012-09-08: qty 2

## 2012-09-08 MED ORDER — PHENYLEPHRINE 40 MCG/ML (10ML) SYRINGE FOR IV PUSH (FOR BLOOD PRESSURE SUPPORT)
80.0000 ug | PREFILLED_SYRINGE | INTRAVENOUS | Status: DC | PRN
  Filled 2012-09-08: qty 2

## 2012-09-08 MED ORDER — LANOLIN HYDROUS EX OINT: TOPICAL_OINTMENT | CUTANEOUS | Status: DC | PRN

## 2012-09-08 MED ORDER — LACTATED RINGERS IV SOLN: 500.0000 mL | Freq: Once | INTRAVENOUS | Status: DC

## 2012-09-08 MED ORDER — NALBUPHINE SYRINGE 5 MG/0.5 ML
10.0000 mg | INJECTION | INTRAMUSCULAR | Status: DC | PRN
  Filled 2012-09-08: qty 1

## 2012-09-08 MED ORDER — FENTANYL CITRATE 0.05 MG/ML IJ SOLN: 100.0000 ug | INTRAMUSCULAR | Status: DC | PRN

## 2012-09-08 MED ORDER — SODIUM CHLORIDE 0.9 % IV SOLN
2.0000 g | Freq: Four times a day (QID) | INTRAVENOUS | Status: DC
  Administered 2012-09-08: 2 g via INTRAVENOUS
  Filled 2012-09-08 (×3): qty 2000

## 2012-09-08 MED ORDER — WITCH HAZEL-GLYCERIN EX PADS: 1.0000 "application " | MEDICATED_PAD | CUTANEOUS | Status: DC | PRN

## 2012-09-08 MED ORDER — FENTANYL 2.5 MCG/ML BUPIVACAINE 1/10 % EPIDURAL INFUSION (WH - ANES)
14.0000 mL/h | INTRAMUSCULAR | Status: DC | PRN
  Administered 2012-09-08 (×2): 14 mL/h via EPIDURAL
  Filled 2012-09-08 (×2): qty 125

## 2012-09-08 MED ORDER — ONDANSETRON HCL 4 MG/2ML IJ SOLN: 4.0000 mg | Freq: Four times a day (QID) | INTRAMUSCULAR | Status: DC | PRN

## 2012-09-08 MED ORDER — ZOLPIDEM TARTRATE 5 MG PO TABS: 5.0000 mg | ORAL_TABLET | Freq: Every evening | ORAL | Status: DC | PRN

## 2012-09-08 MED ORDER — OXYTOCIN 40 UNITS IN LACTATED RINGERS INFUSION - SIMPLE MED
62.5000 mL/h | INTRAVENOUS | Status: DC
  Filled 2012-09-08: qty 1000

## 2012-09-08 MED ORDER — OXYCODONE-ACETAMINOPHEN 5-325 MG PO TABS: 1.0000 | ORAL_TABLET | ORAL | Status: DC | PRN

## 2012-09-08 MED ORDER — TETANUS-DIPHTH-ACELL PERTUSSIS 5-2.5-18.5 LF-MCG/0.5 IM SUSP: 0.5000 mL | Freq: Once | INTRAMUSCULAR | Status: DC

## 2012-09-08 MED ORDER — PHENYLEPHRINE 40 MCG/ML (10ML) SYRINGE FOR IV PUSH (FOR BLOOD PRESSURE SUPPORT)
80.0000 ug | PREFILLED_SYRINGE | INTRAVENOUS | Status: DC | PRN
  Filled 2012-09-08: qty 2
  Filled 2012-09-08: qty 5

## 2012-09-08 MED ORDER — EPHEDRINE 5 MG/ML INJ
10.0000 mg | INTRAVENOUS | Status: DC | PRN
  Filled 2012-09-08: qty 2

## 2012-09-08 MED ORDER — ONDANSETRON HCL 4 MG PO TABS: 4.0000 mg | ORAL_TABLET | ORAL | Status: DC | PRN

## 2012-09-08 MED ORDER — DIBUCAINE 1 % RE OINT: 1.0000 "application " | TOPICAL_OINTMENT | RECTAL | Status: DC | PRN

## 2012-09-08 MED ORDER — BENZOCAINE-MENTHOL 20-0.5 % EX AERO: 1.0000 "application " | INHALATION_SPRAY | CUTANEOUS | Status: DC | PRN

## 2012-09-08 MED ORDER — LACTATED RINGERS IV SOLN
500.0000 mL | INTRAVENOUS | Status: DC | PRN
  Administered 2012-09-08: 500 mL via INTRAVENOUS

## 2012-09-08 MED ORDER — DIPHENHYDRAMINE HCL 50 MG/ML IJ SOLN: 12.5000 mg | INTRAMUSCULAR | Status: DC | PRN

## 2012-09-08 MED ORDER — OXYTOCIN BOLUS FROM INFUSION
500.0000 mL | INTRAVENOUS | Status: DC
  Administered 2012-09-08: 500 mL via INTRAVENOUS

## 2012-09-08 MED ORDER — ONDANSETRON HCL 4 MG/2ML IJ SOLN: 4.0000 mg | INTRAMUSCULAR | Status: DC | PRN

## 2012-09-08 MED ORDER — CITRIC ACID-SODIUM CITRATE 334-500 MG/5ML PO SOLN: 30.0000 mL | ORAL | Status: DC | PRN

## 2012-09-08 MED ORDER — DIPHENHYDRAMINE HCL 25 MG PO CAPS: 25.0000 mg | ORAL_CAPSULE | Freq: Four times a day (QID) | ORAL | Status: DC | PRN

## 2012-09-08 MED ORDER — IBUPROFEN 600 MG PO TABS
600.0000 mg | ORAL_TABLET | Freq: Four times a day (QID) | ORAL | Status: DC
  Administered 2012-09-09 – 2012-09-10 (×5): 600 mg via ORAL
  Filled 2012-09-08 (×6): qty 1

## 2012-09-08 NOTE — H&P (Signed)
Attestation of Attending Supervision of Advanced Practitioner: Evaluation and management procedures were performed by the PA/NP/CNM/OB Fellow under my supervision/collaboration. Chart reviewed and agree with management and plan.  Nicanor Mendolia V 09/08/2012 3:55 PM

## 2012-09-08 NOTE — MAU Provider Note (Signed)
Attestation of Attending Supervision of Advanced Practitioner (CNM/NP): Evaluation and management procedures were performed by the Advanced Practitioner under my supervision and collaboration. I have reviewed the Advanced Practitioner's note and chart, and I agree with the management and plan.  LEGGETT,KELLY H. 11:27 AM   

## 2012-09-08 NOTE — Anesthesia Postprocedure Evaluation (Signed)
Anesthesia Post Note  Patient: Karina Clayton  Procedure(s) Performed: * No procedures listed *  Anesthesia type: Epidural  Patient location: Mother/Baby  Post pain: Pain level controlled  Post assessment: Post-op Vital signs reviewed  Last Vitals: BP 146/87  Pulse 78  Temp(Src) 36.4 C (Oral)  Resp 18  Ht 5\' 7"  (1.702 m)  Wt 180 lb (81.647 kg)  BMI 28.19 kg/m2  SpO2 99%  LMP 12/10/2011  Post vital signs: Reviewed  Level of consciousness: awake  Complications: No apparent anesthesia complicationsAnesthesia Post Note  Patient: Karina Clayton  Procedure(s) Performed: * No procedures listed *  Anesthesia type: Epidural  Patient location: Mother/Baby  Post pain: Pain level controlled  Post assessment: Post-op Vital signs reviewed  Last Vitals: BP 146/87  Pulse 78  Temp(Src) 36.4 C (Oral)  Resp 18  Ht 5\' 7"  (1.702 m)  Wt 180 lb (81.647 kg)  BMI 28.19 kg/m2  SpO2 99%  LMP 12/10/2011  Post vital signs: Reviewed  Level of consciousness: awake  Complications: No apparent anesthesia complications

## 2012-09-08 NOTE — Anesthesia Preprocedure Evaluation (Signed)

## 2012-09-08 NOTE — MAU Note (Signed)
Noticed some dark red blood when using the bathroom prior to coming to the hospital. Abdominal cramping about every 2 minutes.

## 2012-09-08 NOTE — Anesthesia Procedure Notes (Signed)
Epidural Patient location during procedure: OB  Preanesthetic Checklist Completed: patient identified, site marked, surgical consent, pre-op evaluation, timeout performed, IV checked, risks and benefits discussed and monitors and equipment checked  Epidural Patient position: sitting Prep: site prepped and draped and DuraPrep Patient monitoring: continuous pulse ox and blood pressure Approach: midline Injection technique: LOR air  Needle:  Needle type: Tuohy  Needle gauge: 17 G Needle length: 9 cm and 9 Needle insertion depth: 6 cm Catheter type: closed end flexible Catheter size: 19 Gauge Catheter at skin depth: 12 cm Test dose: negative  Assessment Events: blood not aspirated, injection not painful, no injection resistance, negative IV test and paresthesia  Additional Notes R leg transient parasthesia Dosing of Epidural:  1st dose, through catheter ............................................Marland Kitchen epi 1:200K + Xylocaine 40 mg  2nd dose, through catheter, after waiting 3 minutes...Marland KitchenMarland Kitchenepi 1:200K + Xylocaine 60 mg    ( 2% Xylo charted as a single dose in Epic Meds for ease of charting; actual dosing was fractionated as above, for saftey's sake)  As each dose occurred, patient was free of IV sx; and patient exhibited no evidence of SA injection.  Patient is more comfortable after epidural dosed. Please see RN's note for documentation of vital signs,and FHR which are stable.  Patient reminded not to try to ambulate with numb legs, and that an RN must be present when she attempts to get up.

## 2012-09-08 NOTE — H&P (Signed)
Karina Clayton is a 27 y.o. female G1 at 39.0wks presenting for eval of ctx since approx 2300. Reports sm dk red blood; denies leaking. Received PNC at [facility] and her pregnancy has been remarkable for 1) GBS pos 2) bilat cardiac EIF- stable 3) hx migranes. History OB History   Grav Para Term Preterm Abortions TAB SAB Ect Mult Living   1              Past Medical History  Diagnosis Date  . Headache     migraines with aura  . Psoriasis   . Abnormal Pap smear    Past Surgical History  Procedure Laterality Date  . No past surgeries    . Colposcopy w/ biopsy / curettage     Family History: family history includes Bipolar disorder in her other; Cancer in her other; Coronary artery disease in her other; Diabetes in her other; Hypertension in her other; and Multiple sclerosis in her other. Social History:  reports that she has never smoked. She does not have any smokeless tobacco history on file. She reports that she does not drink alcohol or use illicit drugs.   Prenatal Transfer Tool  Maternal Diabetes: No Genetic Screening: Normal Maternal Ultrasounds/Referrals: Normal Fetal Ultrasounds or other Referrals:  Other: stable bilat EIF Maternal Substance Abuse:  No Significant Maternal Medications:  None Significant Maternal Lab Results:  Lab values include: Group B Strep positive Other Comments:  None  ROS  Dilation: 7 Exam by:: Philipp Deputy CNM Blood pressure 144/89, pulse 109, temperature 97.7 F (36.5 C), temperature source Oral, resp. rate 18, last menstrual period 12/10/2011. Maternal Exam:  Uterine Assessment: Ctx q 2 mins  Cervix: Cx 7/90/0, BBOW that is almost funneling  Fetal Exam Fetal Monitor Review: Baseline rate: 140.  Variability: moderate (6-25 bpm).   Pattern: accelerations present and no decelerations.       Physical Exam  Constitutional: She is oriented to person, place, and time. She appears well-developed and well-nourished.  HENT:  Head:  Normocephalic.  Cardiovascular:  Sl tachycardic  Respiratory: Effort normal.  Genitourinary: Vagina normal.  Musculoskeletal: Normal range of motion.  Neurological: She is alert and oriented to person, place, and time.  Skin: Skin is warm and dry.  Psychiatric: She has a normal mood and affect. Her behavior is normal. Thought content normal.    Prenatal labs: ABO, Rh: B/Positive/-- (09/05 0000) Antibody: Negative (09/05 0000) Rubella: Immune (09/05 0000) RPR: Nonreactive (01/03 0000)  HBsAg: Negative (09/05 0000)  HIV: Non-reactive (01/03 0000)  GBS: Positive (03/21 0000)   Assessment/Plan: IUP at 39.0wks Active labor GBS pos  Admit to [facility] for GBS prophylaxis Plans IV pain meds Anticipate SVD   Karina Clayton 09/08/2012, 4:16 AM

## 2012-09-09 ENCOUNTER — Encounter: Payer: Medicaid Other | Admitting: Obstetrics & Gynecology

## 2012-09-09 NOTE — Progress Notes (Signed)
UR completed 

## 2012-09-09 NOTE — Progress Notes (Signed)
Post Partum Day 1 Subjective: no complaints, up ad lib, voiding, tolerating PO and + flatus  Objective: Blood pressure 107/67, pulse 69, temperature 97.9 F (36.6 C), temperature source Oral, resp. rate 18, height 5\' 7"  (1.702 m), weight 180 lb (81.647 kg), last menstrual period 12/10/2011, SpO2 99.00%, unknown if currently breastfeeding.  Physical Exam:  General: alert, cooperative and appears stated age Lochia: appropriate Uterine Fundus: firm DVT Evaluation: No evidence of DVT seen on physical exam. No cords or calf tenderness. No significant calf/ankle edema.   Recent Labs  09/08/12 0415  HGB 14.2  HCT 39.9    Assessment/Plan: Plan for discharge tomorrow and Contraception nexplanon    LOS: 1 day   Rosalee Kaufman 09/09/2012, 7:35 AM   I saw and examined patient along with student and agree with above note.   Naod Sweetland 09/12/2012 9:35 AM

## 2012-09-10 NOTE — Discharge Summary (Signed)
Obstetric Discharge Summary Karina Clayton is a 27 y.o. female G1 at 39.0wks planning to use nexplanon as contraception. Pain is well controlled.    Reason for Admission: onset of labor Prenatal Procedures: none Intrapartum Procedures: spontaneous vaginal delivery and GBS prophylaxis Postpartum Procedures: none Complications-Operative and Postpartum: none Hemoglobin  Date Value Range Status  09/08/2012 14.2  12.0 - 15.0 g/dL Final     HCT  Date Value Range Status  09/08/2012 39.9  36.0 - 46.0 % Final    Physical Exam:  General: alert, cooperative, appears stated age and no distress Lochia: appropriate Uterine Fundus: soft DVT Evaluation: No evidence of DVT seen on physical exam. No cords or calf tenderness. No significant calf/ankle edema.  Discharge Diagnoses: Term Pregnancy-delivered  Discharge Information: Date: 09/10/2012 Activity: pelvic rest Diet: routine Medications: Ibuprofen and Percocet Condition: stable Instructions: refer to practice specific booklet Discharge to: home   Newborn Data: Live born female  Birth Weight: 7 lb 2.6 oz (3250 g) APGAR: 8, 9  Home with mother.  Rosalee Kaufman 09/10/2012, 7:12 AM I have seen and examined this patient and agree the above assessment. CRESENZO-DISHMAN,Cynthea Zachman 09/10/2012 7:57 AM

## 2012-09-13 NOTE — Discharge Summary (Signed)
Attestation of Attending Supervision of Advanced Practitioner (CNM/NP): Evaluation and management procedures were performed by the Advanced Practitioner under my supervision and collaboration.  I have reviewed the Advanced Practitioner's note and chart, and I agree with the management and plan.  HARRAWAY-SMITH, Dacota Ruben 3:35 PM     

## 2012-09-17 ENCOUNTER — Ambulatory Visit: Payer: Medicaid Other | Admitting: Obstetrics & Gynecology

## 2012-09-30 ENCOUNTER — Ambulatory Visit (INDEPENDENT_AMBULATORY_CARE_PROVIDER_SITE_OTHER): Payer: Medicaid Other | Admitting: Advanced Practice Midwife

## 2012-09-30 ENCOUNTER — Encounter: Payer: Self-pay | Admitting: Advanced Practice Midwife

## 2012-09-30 VITALS — BP 120/84 | Wt 160.2 lb

## 2012-09-30 DIAGNOSIS — Z30017 Encounter for initial prescription of implantable subdermal contraceptive: Secondary | ICD-10-CM

## 2012-09-30 DIAGNOSIS — Z3202 Encounter for pregnancy test, result negative: Secondary | ICD-10-CM

## 2012-09-30 DIAGNOSIS — IMO0001 Reserved for inherently not codable concepts without codable children: Secondary | ICD-10-CM

## 2012-09-30 LAB — POCT URINE PREGNANCY: Preg Test, Ur: NEGATIVE

## 2012-09-30 MED ORDER — ETONOGESTREL 68 MG ~~LOC~~ IMPL: 68.0000 mg | DRUG_IMPLANT | Freq: Once | SUBCUTANEOUS | Status: DC

## 2012-09-30 NOTE — Progress Notes (Signed)
ICESS BERTONI is a 27 y.o. year old African American female here for Nexplanon insertion.  Her LMP was none since birth.  Has not been sexually active since the birth , and her pregnancy test today was negative.  Risks/benefits/side effects of Nexplanon have been discussed and her questions have been answered.  Specifically, a failure rate of 06/998 has been reported, with an increased failure rate if pt takes St. John's Wort and/or antiseizure medicaitons.  ANACAROLINA EVELYN is aware of the common side effect of irregular bleeding, which the incidence of decreases over time.  Her left arm, approximatly 4 inches proximal from the elbow, was cleansed with alcohol and anesthetized with 2cc of 2% Lidocaine.  The area was cleansed again and the Nexplanon was inserted without difficulty.  A pressure bandage was applied.  Pt was instructed to remove pressure bandage in a few hours, and keep insertion site covered with a bandaid for 3 days.  Back up contraception was recommended for 2 weeks.  Follow-up scheduled for post partum appt CRESENZO-DISHMAN,Shanine Kreiger 09/30/2012 10:24 AM

## 2012-09-30 NOTE — Assessment & Plan Note (Signed)
No estrogen containing contraception

## 2012-09-30 NOTE — Patient Instructions (Addendum)

## 2012-10-21 ENCOUNTER — Encounter: Payer: Self-pay | Admitting: Obstetrics & Gynecology

## 2012-10-21 ENCOUNTER — Ambulatory Visit (INDEPENDENT_AMBULATORY_CARE_PROVIDER_SITE_OTHER): Payer: Medicaid Other | Admitting: Obstetrics & Gynecology

## 2012-10-21 NOTE — Progress Notes (Signed)
Patient ID: Karina Clayton, female   DOB: Aug 03, 1985, 27 y.o.   MRN: [id number] Subjective:     Karina Clayton is a 27 y.o. female who presents for a postpartum visit. She is 6 weeks postpartum following a spontaneous vaginal delivery. I have fully reviewed the prenatal and intrapartum course. The delivery was at 39 gestational weeks. Outcome: spontaneous vaginal delivery. Anesthesia: epidural. Postpartum course has been unremarkable. Baby's course has been normal. Baby is feeding by bottle - [employer] Good Start. Bleeding staining only. Bowel function is normal. Bladder function is normal. Patient is not sexually active. Contraception method is Nexplanon. Postpartum depression screening: negative.  The following portions of the patient's history were reviewed and updated as appropriate: allergies, current medications, past family history, past medical history, past social history, past surgical history and problem list.  Review of Systems Pertinent items are noted in HPI.   Objective:    BP 128/80  Wt 161 lb (73.029 kg)  BMI 25.21 kg/m2  LMP 10/07/2012                                         Assessment:     Normal postpartum exam. Pap smear not done at today's visit.   Plan:    1. Contraception: Nexplanon 2.  3. Follow up in: prn yearly or as needed.

## 2012-11-26 ENCOUNTER — Telehealth: Payer: Self-pay | Admitting: Adult Health

## 2012-11-26 NOTE — Telephone Encounter (Signed)
Pt states has nexplanon x 8 weeks, having light brownish spotting. Informed pt was normal to have some spotting with the nexplanon, especially since she got it inserted 8 weeks ago, could take anywhere to 3 - 6 months to see improvement. Pt verbalized understanding.

## 2012-12-07 ENCOUNTER — Encounter (HOSPITAL_COMMUNITY): Payer: Self-pay | Admitting: *Deleted

## 2012-12-07 ENCOUNTER — Emergency Department (HOSPITAL_COMMUNITY): Payer: Managed Care, Other (non HMO)

## 2012-12-07 ENCOUNTER — Emergency Department (HOSPITAL_COMMUNITY)
Admission: EM | Admit: 2012-12-07 | Discharge: 2012-12-07 | Disposition: A | Discharge status: Home / Self Care | Payer: Managed Care, Other (non HMO) | Attending: Emergency Medicine | Admitting: Emergency Medicine

## 2012-12-07 DIAGNOSIS — R110 Nausea: Secondary | ICD-10-CM | POA: Insufficient documentation

## 2012-12-07 DIAGNOSIS — R51 Headache: Secondary | ICD-10-CM | POA: Insufficient documentation

## 2012-12-07 DIAGNOSIS — Z3202 Encounter for pregnancy test, result negative: Secondary | ICD-10-CM | POA: Insufficient documentation

## 2012-12-07 DIAGNOSIS — N12 Tubulo-interstitial nephritis, not specified as acute or chronic: Secondary | ICD-10-CM | POA: Insufficient documentation

## 2012-12-07 DIAGNOSIS — J029 Acute pharyngitis, unspecified: Secondary | ICD-10-CM | POA: Insufficient documentation

## 2012-12-07 DIAGNOSIS — K5900 Constipation, unspecified: Secondary | ICD-10-CM | POA: Insufficient documentation

## 2012-12-07 DIAGNOSIS — R509 Fever, unspecified: Secondary | ICD-10-CM | POA: Insufficient documentation

## 2012-12-07 DIAGNOSIS — Z872 Personal history of diseases of the skin and subcutaneous tissue: Secondary | ICD-10-CM | POA: Insufficient documentation

## 2012-12-07 LAB — CBC WITH DIFFERENTIAL/PLATELET
Basophils Absolute: 0 10*3/uL (ref 0.0–0.1)
Eosinophils Absolute: 0.1 10*3/uL (ref 0.0–0.7)
Eosinophils Relative: 1 % (ref 0–5)
Lymphs Abs: 1.2 10*3/uL (ref 0.7–4.0)
MCH: 31.3 pg (ref 26.0–34.0)
MCHC: 34.1 g/dL (ref 30.0–36.0)
MCV: 91.8 fL (ref 78.0–100.0)
Platelets: 208 10*3/uL (ref 150–400)
RDW: 12.8 % (ref 11.5–15.5)

## 2012-12-07 LAB — COMPREHENSIVE METABOLIC PANEL
AST: 21 U/L (ref 0–37)
BUN: 9 mg/dL (ref 6–23)
CO2: 28 mEq/L (ref 19–32)
Calcium: 9.4 mg/dL (ref 8.4–10.5)
Chloride: 102 mEq/L (ref 96–112)
Creatinine, Ser: 0.98 mg/dL (ref 0.50–1.10)
GFR calc non Af Amer: 80 mL/min — ABNORMAL LOW (ref >90)
Total Bilirubin: 0.8 mg/dL (ref 0.3–1.2)

## 2012-12-07 LAB — URINALYSIS, ROUTINE W REFLEX MICROSCOPIC
Glucose, UA: NEGATIVE mg/dL
Ketones, ur: NEGATIVE mg/dL
Protein, ur: NEGATIVE mg/dL

## 2012-12-07 LAB — URINE MICROSCOPIC-ADD ON

## 2012-12-07 MED ORDER — ONDANSETRON 4 MG PO TBDP: 4.0000 mg | ORAL_TABLET | Freq: Three times a day (TID) | ORAL | Status: DC | PRN

## 2012-12-07 MED ORDER — HYDROCODONE-ACETAMINOPHEN 5-325 MG PO TABS: 1.0000 | ORAL_TABLET | Freq: Four times a day (QID) | ORAL | Status: DC | PRN

## 2012-12-07 MED ORDER — CEPHALEXIN 500 MG PO CAPS: 500.0000 mg | ORAL_CAPSULE | Freq: Four times a day (QID) | ORAL | Status: DC

## 2012-12-07 MED ORDER — DEXTROSE 5 % IV SOLN
1.0000 g | Freq: Once | INTRAVENOUS | Status: AC
  Administered 2012-12-07: 1 g via INTRAVENOUS
  Filled 2012-12-07: qty 10

## 2012-12-07 MED ORDER — IOHEXOL 300 MG/ML  SOLN
100.0000 mL | Freq: Once | INTRAMUSCULAR | Status: AC | PRN
  Administered 2012-12-07: 100 mL via INTRAVENOUS

## 2012-12-07 MED ORDER — SODIUM CHLORIDE 0.9 % IV SOLN
INTRAVENOUS | Status: DC
  Administered 2012-12-07: 17:00:00 via INTRAVENOUS

## 2012-12-07 MED ORDER — IOHEXOL 300 MG/ML  SOLN
50.0000 mL | Freq: Once | INTRAMUSCULAR | Status: AC | PRN
  Administered 2012-12-07: 50 mL via ORAL

## 2012-12-07 MED ORDER — ONDANSETRON HCL 4 MG/2ML IJ SOLN
4.0000 mg | Freq: Once | INTRAMUSCULAR | Status: AC
  Administered 2012-12-07: 4 mg via INTRAVENOUS
  Filled 2012-12-07: qty 2

## 2012-12-07 MED ORDER — HYDROMORPHONE HCL PF 1 MG/ML IJ SOLN
1.0000 mg | Freq: Once | INTRAMUSCULAR | Status: AC
  Administered 2012-12-07: 1 mg via INTRAVENOUS
  Filled 2012-12-07: qty 1

## 2012-12-07 MED ORDER — ONDANSETRON HCL 4 MG/2ML IJ SOLN
4.0000 mg | Freq: Once | INTRAMUSCULAR | Status: AC
  Administered 2012-12-07: 4 mg via INTRAVENOUS

## 2012-12-07 MED ORDER — SODIUM CHLORIDE 0.9 % IV BOLUS (SEPSIS)
500.0000 mL | Freq: Once | INTRAVENOUS | Status: AC
  Administered 2012-12-07: 500 mL via INTRAVENOUS

## 2012-12-07 NOTE — ED Notes (Signed)
Pt reports RLQ pain for the past few days with nausea, and some cold chills.  Pt reports the pain is constant.

## 2012-12-07 NOTE — ED Provider Notes (Signed)
History  This chart was scribed for Shelda Jakes, MD, by Yevette Edwards, ED Scribe. This patient was seen in room APA17/AP[facility]17 and the patient's care was started at 1:44 PM.  CSN: [id number] Arrival date & time 12/07/12  1212  First MD Initiated Contact with Patient 12/07/12 1317     Chief Complaint  Patient presents with  . Abdominal Pain  . Constipation    The history is provided by the patient. No language interpreter was used.   HPI Comments: Karina Clayton is a 27 y.o. female who presents to the Emergency Department complaining of intermittent, non-radiating, right lower and upper quadrant abdominal pain which began three days ago. The pt reports that her last bowel movement was four days ago. She states that the pain is sharp and aching, and she ranks the pain currently as a 7/10 with the pain at its worse a 8/10. She has experienced nausea, a headache, fever, chills, and a sore throat as associated symptoms.  She denies a h/o of prior symptoms. She also denies emesis, diarrhea, dysuria,  rhinorrhea, or a cough.  She has a h/o of headaches. She denies smoking or drinking alcohol.   Past Medical History  Diagnosis Date  . Headache(784.0)     migraines with aura  . Psoriasis   . Abnormal Pap smear    Past Surgical History  Procedure Laterality Date  . No past surgeries    . Colposcopy w/ biopsy / curettage     Family History  Problem Relation Age of Onset  . Cancer Other     stomach  . Diabetes Other   . Hypertension Other   . Coronary artery disease Other   . Multiple sclerosis Other   . Bipolar disorder Other    History  Substance Use Topics  . Smoking status: Never Smoker   . Smokeless tobacco: Not on file  . Alcohol Use: No   OB History   Grav Para Term Preterm Abortions TAB SAB Ect Mult Living   1 1 1       1      Review of Systems  Constitutional: Positive for fever and chills.  HENT: Positive for sore throat. Negative for rhinorrhea.   Eyes:  Negative for visual disturbance.  Respiratory: Negative for cough and shortness of breath.   Cardiovascular: Negative for chest pain.  Gastrointestinal: Positive for nausea and abdominal pain. Negative for vomiting and diarrhea.  Genitourinary: Negative for dysuria.  Skin: Negative for rash.  Neurological: Positive for headaches.  Psychiatric/Behavioral: Negative for confusion.    Allergies  Ciprofloxacin  Home Medications   Current Outpatient Rx  Name  Route  Sig  Dispense  Refill  . cephALEXin (KEFLEX) 500 MG capsule   Oral   Take 1 capsule (500 mg total) by mouth 4 (four) times daily.   40 capsule   0   . HYDROcodone-acetaminophen (NORCO/VICODIN) 5-325 MG per tablet   Oral   Take 1-2 tablets by mouth every 6 (six) hours as needed for pain.   10 tablet   0   . ondansetron (ZOFRAN ODT) 4 MG disintegrating tablet   Oral   Take 1 tablet (4 mg total) by mouth every 8 (eight) hours as needed.   10 tablet   0    Triage Vitals: BP 109/90  Pulse 134  Temp(Src) 100.8 F (38.2 C) (Oral)  Resp 21  Ht 5\' 8"  (1.727 m)  Wt 162 lb (73.483 kg)  BMI 24.64 kg/m2  SpO2  100%  LMP 11/07/2012  Physical Exam  Nursing note and vitals reviewed. Constitutional: She is oriented to person, place, and time. She appears well-developed and well-nourished. No distress.  HENT:  Head: Normocephalic and atraumatic.  Eyes: EOM are normal. Pupils are equal, round, and reactive to light. No scleral icterus.  Scelera are clear, no rictus.   Neck: Neck supple. No tracheal deviation present.  Cardiovascular: Normal rate, regular rhythm and normal heart sounds.   Pulmonary/Chest: Effort normal and breath sounds normal. No respiratory distress. She has no wheezes. She has no rales.  Abdominal: Bowel sounds are normal. Tenderness: Tenderness to RUQ. Tenderness to right flank.   Musculoskeletal: Normal range of motion.  Neurological: She is alert and oriented to person, place, and time. No cranial  nerve deficit. Coordination normal.  Skin: Skin is warm and dry.  Psychiatric: She has a normal mood and affect. Her behavior is normal.    ED Course  Procedures (including critical care time)  DIAGNOSTIC STUDIES: Oxygen Saturation is 100% on room air, normal by my interpretation.    COORDINATION OF CARE:  1:48 PM- Discussed treatment plan with pt, and the pt agreed.    Labs Reviewed  COMPREHENSIVE METABOLIC PANEL - Abnormal; Notable for the following:    GFR calc non Af Amer 80 (*)    All other components within normal limits  URINALYSIS, ROUTINE W REFLEX MICROSCOPIC - Abnormal; Notable for the following:    Hgb urine dipstick TRACE (*)    Nitrite POSITIVE (*)    Leukocytes, UA LARGE (*)    All other components within normal limits  URINE MICROSCOPIC-ADD ON - Abnormal; Notable for the following:    Bacteria, UA MANY (*)    All other components within normal limits  URINE CULTURE  LIPASE, BLOOD  CBC WITH DIFFERENTIAL  PREGNANCY, URINE   Ct Abdomen Pelvis W Contrast  12/07/2012   *RADIOLOGY REPORT*  Clinical Data: Right lower quadrant abdominal pain, nausea  CT ABDOMEN AND PELVIS WITH CONTRAST  Technique:  Multidetector CT imaging of the abdomen and pelvis was performed following the standard protocol during bolus administration of intravenous contrast.  Contrast: 100 ml Omnipaque-300 IV  Comparison: None.  Findings: Lung bases are essentially clear.  Liver, spleen, pancreas, and adrenal glands are within normal limits.  Gallbladder is unremarkable.  No intrahepatic or extrahepatic ductal dilatation.  Kidneys are within normal limits.  No hydronephrosis.  No evidence of bowel obstruction.  Normal appendix.  Moderate colonic stool burden.  No evidence of abdominal aortic aneurysm.  No abdominopelvic ascites.  No suspicious abdominopelvic lymphadenopathy.  Uterus and bilateral ovaries are unremarkable.  Bladder is within normal limits.  Visualized osseous structures are within normal  limits.  IMPRESSION: Normal appendix.  No evidence of bowel obstruction.  Moderate colonic stool burden, raising the possibility of constipation.  Otherwise, no CT findings to account for the patient's abdominal pain.   Original Report Authenticated By: Charline Bills, M.D.   Results for orders placed during the hospital encounter of 12/07/12  COMPREHENSIVE METABOLIC PANEL      Result Value Range   Sodium 138  135 - 145 mEq/L   Potassium 3.8  3.5 - 5.1 mEq/L   Chloride 102  96 - 112 mEq/L   CO2 28  19 - 32 mEq/L   Glucose, Bld 95  70 - 99 mg/dL   BUN 9  6 - 23 mg/dL   Creatinine, Ser 1.61  0.50 - 1.10 mg/dL   Calcium 9.4  8.4 - 10.5 mg/dL   Total Protein 7.5  6.0 - 8.3 g/dL   Albumin 3.5  3.5 - 5.2 g/dL   AST 21  0 - 37 U/L   ALT 33  0 - 35 U/L   Alkaline Phosphatase 89  39 - 117 U/L   Total Bilirubin 0.8  0.3 - 1.2 mg/dL   GFR calc non Af Amer 80 (*) >90 mL/min   GFR calc Af Amer >90  >90 mL/min  LIPASE, BLOOD      Result Value Range   Lipase 23  11 - 59 U/L  CBC WITH DIFFERENTIAL      Result Value Range   WBC 7.7  4.0 - 10.5 K/uL   RBC 4.53  3.87 - 5.11 MIL/uL   Hemoglobin 14.2  12.0 - 15.0 g/dL   HCT 13.0  86.5 - 78.4 %   MCV 91.8  78.0 - 100.0 fL   MCH 31.3  26.0 - 34.0 pg   MCHC 34.1  30.0 - 36.0 g/dL   RDW 69.6  29.5 - 28.4 %   Platelets 208  150 - 400 K/uL   Neutrophils Relative % 73  43 - 77 %   Neutro Abs 5.6  1.7 - 7.7 K/uL   Lymphocytes Relative 16  12 - 46 %   Lymphs Abs 1.2  0.7 - 4.0 K/uL   Monocytes Relative 10  3 - 12 %   Monocytes Absolute 0.8  0.1 - 1.0 K/uL   Eosinophils Relative 1  0 - 5 %   Eosinophils Absolute 0.1  0.0 - 0.7 K/uL   Basophils Relative 0  0 - 1 %   Basophils Absolute 0.0  0.0 - 0.1 K/uL  URINALYSIS, ROUTINE W REFLEX MICROSCOPIC      Result Value Range   Color, Urine YELLOW  YELLOW   APPearance CLEAR  CLEAR   Specific Gravity, Urine 1.010  1.005 - 1.030   pH 6.5  5.0 - 8.0   Glucose, UA NEGATIVE  NEGATIVE mg/dL   Hgb urine  dipstick TRACE (*) NEGATIVE   Bilirubin Urine NEGATIVE  NEGATIVE   Ketones, ur NEGATIVE  NEGATIVE mg/dL   Protein, ur NEGATIVE  NEGATIVE mg/dL   Urobilinogen, UA 0.2  0.0 - 1.0 mg/dL   Nitrite POSITIVE (*) NEGATIVE   Leukocytes, UA LARGE (*) NEGATIVE  PREGNANCY, URINE      Result Value Range   Preg Test, Ur NEGATIVE  NEGATIVE  URINE MICROSCOPIC-ADD ON      Result Value Range   Squamous Epithelial / LPF RARE  RARE   WBC, UA TOO NUMEROUS TO COUNT  <3 WBC/hpf   RBC / HPF 0-2  <3 RBC/hpf   Bacteria, UA MANY (*) RARE       1. Pyelonephritis     MDM  CT scan negative for any acute intra-abdominal problems. Based on urinalysis her peers a urinary tract infection based on patient's symptoms of be consistent with pyelonephritis. Patient with low-grade fever here 100.8. Patient improved in the emergency part with fluids pain under better control patient will get a trial of outpatient antibiotics patient given first dose of Rocephin 1 g IV piggyback here. We continued on Keflex. At home with pain medicine antinausea medicine.  I personally performed the services described in this documentation, which was scribed in my presence. The recorded information has been reviewed and is accurate.     Shelda Jakes, MD 12/07/12 [phone]

## 2012-12-07 NOTE — ED Notes (Signed)
Pt c/o right side pain with nausea, headache, constipation since Sunday. Last bowel movement was Saturday and normal per pt.

## 2012-12-09 LAB — URINE CULTURE

## 2012-12-10 NOTE — ED Notes (Signed)
Post ED Visit - Positive Culture Follow-up  Culture report reviewed by antimicrobial stewardship pharmacist: []  Wes Dulaney, Pharm.D., BCPS []  Celedonio Miyamoto, [address].D., BCPS [x]  Georgina Pillion, Pharm.D., BCPS []  [place], [address].D., BCPS, AAHIVP []  Estella Husk, Pharm.D., BCPS, AAHIVP  Positive urine culture Treated with Cephalexin , organism sensitive to the same and no further patient follow-up is required at this time.  Larena Sox 12/10/2012, 4:42 PM

## 2013-02-02 ENCOUNTER — Encounter: Payer: Self-pay | Admitting: Advanced Practice Midwife

## 2013-02-02 ENCOUNTER — Ambulatory Visit (INDEPENDENT_AMBULATORY_CARE_PROVIDER_SITE_OTHER): Payer: Managed Care, Other (non HMO) | Admitting: Advanced Practice Midwife

## 2013-02-02 VITALS — BP 120/80 | Ht 68.0 in | Wt 186.0 lb

## 2013-02-02 DIAGNOSIS — Z3046 Encounter for surveillance of implantable subdermal contraceptive: Secondary | ICD-10-CM

## 2013-02-02 DIAGNOSIS — Z3009 Encounter for other general counseling and advice on contraception: Secondary | ICD-10-CM

## 2013-02-02 MED ORDER — NORETHIN-ETH ESTRAD-FE BIPHAS 1 MG-10 MCG / 10 MCG PO TABS: 1.0000 | ORAL_TABLET | Freq: Every day | ORAL | Status: DC

## 2013-02-02 NOTE — Progress Notes (Signed)
Karina Clayton 26 y.o. Got a postpartum Nexplanon in April. She has since gained 25 lbs.  She wants COC's  Patient given informed consent for removal of her Implanon, time out was performed.  Signed copy in the chart.  Appropriate time out taken. Implanon site identified.  Area prepped in usual sterile fashon. One cc of 1% lidocaine was used to anesthetize the area at the distal end of the implant. A small stab incision was made right beside the implant on the distal portion.  The implanon rod was grasped using hemostats and removed without difficulty.  There was less than 3 cc blood loss. There were no complications.  A small amount of antibiotic ointment and steri-strips were applied over the small incision.  A pressure bandage was applied to reduce any bruising.  The patient tolerated the procedure well and was given post procedure instructions.   Start COC's today.  B/U for 3 weeks

## 2013-02-02 NOTE — Patient Instructions (Addendum)
Start pills today or tomorrow  Use condoms for 3 weeks.

## 2013-04-18 ENCOUNTER — Emergency Department (HOSPITAL_COMMUNITY)
Admission: EM | Admit: 2013-04-18 | Discharge: 2013-04-18 | Disposition: A | Discharge status: Home / Self Care | Payer: Managed Care, Other (non HMO) | Attending: Emergency Medicine | Admitting: Emergency Medicine

## 2013-04-18 ENCOUNTER — Encounter (HOSPITAL_COMMUNITY): Payer: Self-pay | Admitting: Emergency Medicine

## 2013-04-18 DIAGNOSIS — Z872 Personal history of diseases of the skin and subcutaneous tissue: Secondary | ICD-10-CM | POA: Insufficient documentation

## 2013-04-18 DIAGNOSIS — T7840XA Allergy, unspecified, initial encounter: Secondary | ICD-10-CM

## 2013-04-18 DIAGNOSIS — Z79899 Other long term (current) drug therapy: Secondary | ICD-10-CM | POA: Insufficient documentation

## 2013-04-18 DIAGNOSIS — R109 Unspecified abdominal pain: Secondary | ICD-10-CM | POA: Insufficient documentation

## 2013-04-18 DIAGNOSIS — R21 Rash and other nonspecific skin eruption: Secondary | ICD-10-CM | POA: Insufficient documentation

## 2013-04-18 DIAGNOSIS — R000 Tachycardia, unspecified: Secondary | ICD-10-CM | POA: Insufficient documentation

## 2013-04-18 DIAGNOSIS — J029 Acute pharyngitis, unspecified: Secondary | ICD-10-CM | POA: Insufficient documentation

## 2013-04-18 DIAGNOSIS — Z792 Long term (current) use of antibiotics: Secondary | ICD-10-CM | POA: Insufficient documentation

## 2013-04-18 DIAGNOSIS — Z8679 Personal history of other diseases of the circulatory system: Secondary | ICD-10-CM | POA: Insufficient documentation

## 2013-04-18 DIAGNOSIS — I10 Essential (primary) hypertension: Secondary | ICD-10-CM | POA: Insufficient documentation

## 2013-04-18 DIAGNOSIS — R220 Localized swelling, mass and lump, head: Secondary | ICD-10-CM | POA: Insufficient documentation

## 2013-04-18 MED ORDER — DEXAMETHASONE SODIUM PHOSPHATE 4 MG/ML IJ SOLN
12.0000 mg | Freq: Once | INTRAMUSCULAR | Status: AC
  Administered 2013-04-18: 12 mg via INTRAVENOUS
  Filled 2013-04-18: qty 3

## 2013-04-18 MED ORDER — EPINEPHRINE 0.3 MG/0.3ML IJ SOAJ: 0.3000 mg | INTRAMUSCULAR | Status: DC | PRN

## 2013-04-18 MED ORDER — FAMOTIDINE IN NACL 20-0.9 MG/50ML-% IV SOLN
20.0000 mg | Freq: Once | INTRAVENOUS | Status: AC
  Administered 2013-04-18: 20 mg via INTRAVENOUS
  Filled 2013-04-18: qty 50

## 2013-04-18 MED ORDER — DIPHENHYDRAMINE HCL 50 MG/ML IJ SOLN
50.0000 mg | Freq: Once | INTRAMUSCULAR | Status: AC
  Administered 2013-04-18: 50 mg via INTRAVENOUS
  Filled 2013-04-18: qty 1

## 2013-04-18 MED ORDER — SODIUM CHLORIDE 0.9 % IV BOLUS (SEPSIS)
1000.0000 mL | Freq: Once | INTRAVENOUS | Status: AC
  Administered 2013-04-18: 1000 mL via INTRAVENOUS

## 2013-04-18 NOTE — ED Notes (Signed)
Pt presents with hives over most of body, face and extremities. Minor facial swelling noted. Pt denies SOB, " feels a little tight". No distress noted. Pt denies new foods, soap and medication.  No distress noted.

## 2013-04-18 NOTE — ED Notes (Addendum)
Onset 20 min ago with  Difficulty swallowing , urticarial rash.

## 2013-04-18 NOTE — ED Provider Notes (Signed)
CSN: [id number]     Arrival date & time 04/18/13  1144 History  This chart was scribed for [name], MD by Bennett Scrape, ED Scribe. This patient was seen in room APA12/APA12 and the patient's care was started at 11:59 AM.    Chief Complaint  Patient presents with  . Allergic Reaction    The history is provided by the patient. No language interpreter was used.    HPI Comments: Karina Clayton is a 27 y.o. female who presents to the Emergency Department complaining of a gradual onset, gradually worsening, persistent allergic reaction described as a sore throat described as a tightness, facial swelling, diffuse urticarial rash and crampy abdominal pain that started around 20 to 30 minutes PTA. She reports that she was around dogs this morning which she has not had much exposure to due to her sister's allergy to dogs. She denies any prior episodes of the same and denies any other new exposures. She states that she has seasonal allergies that usually consistent of congestion and denies having an epi pen at home. She denies any nausea, dizziness, lightheadedness or SOB.  Past Medical History  Diagnosis Date  . Headache(784.0)     migraines with aura  . Psoriasis   . Abnormal Pap smear    Past Surgical History  Procedure Laterality Date  . No past surgeries    . Colposcopy w/ biopsy / curettage     Family History  Problem Relation Age of Onset  . Cancer Other     stomach  . Diabetes Other   . Hypertension Other   . Coronary artery disease Other   . Multiple sclerosis Other   . Bipolar disorder Other    History  Substance Use Topics  . Smoking status: Never Smoker   . Smokeless tobacco: Not on file     Comment: never used snuff or chewing tobacco.  . Alcohol Use: No   OB History   Grav Para Term Preterm Abortions TAB SAB Ect Mult Living   1 1 1       1      Review of Systems  HENT: Positive for sore throat. Negative for trouble swallowing.   Respiratory: Negative for  shortness of breath.   Cardiovascular: Negative for chest pain.  Gastrointestinal: Positive for abdominal pain. Negative for nausea and diarrhea.  Skin: Positive for rash.  All other systems reviewed and are negative.    Allergies  Ciprofloxacin  Home Medications   Current Outpatient Rx  Name  Route  Sig  Dispense  Refill  . cephALEXin (KEFLEX) 500 MG capsule   Oral   Take 1 capsule (500 mg total) by mouth 4 (four) times daily.   40 capsule   0   . clobetasol cream (TEMOVATE) 0.05 %               . Norethindrone-Ethinyl Estradiol-Fe Biphas (LO LOESTRIN FE) 1 MG-10 MCG / 10 MCG tablet   Oral   Take 1 tablet by mouth daily.   1 Package   11   . ondansetron (ZOFRAN ODT) 4 MG disintegrating tablet   Oral   Take 1 tablet (4 mg total) by mouth every 8 (eight) hours as needed.   10 tablet   0    Triage Vitals: BP 107/76  Pulse 114  Temp(Src) 97.6 F (36.4 C) (Oral)  Resp 20  Ht 5\' 8"  (1.727 m)  Wt 180 lb (81.647 kg)  BMI 27.38 kg/m2  SpO2 100%  LMP 03/27/2013  Breastfeeding? No  Physical Exam  Nursing note and vitals reviewed. Constitutional: She is oriented to person, place, and time. She appears well-developed and well-nourished. No distress.  HENT:  Head: Normocephalic and atraumatic.  No oral or facial swelling  Eyes: EOM are normal.  Neck: Normal range of motion.  Cardiovascular: Regular rhythm and normal heart sounds.  Tachycardia present.   Pulmonary/Chest: Effort normal and breath sounds normal.  Abdominal: Soft. She exhibits no distension. There is no tenderness.  Musculoskeletal: Normal range of motion.  Neurological: She is alert and oriented to person, place, and time.  Skin: Skin is warm and dry. Rash noted.  Diffuse urticarial rash  Psychiatric: She has a normal mood and affect. Judgment normal.    ED Course  Procedures (including critical care time)  Medications  diphenhydrAMINE (BENADRYL) injection 50 mg (not administered)  sodium  chloride 0.9 % bolus 1,000 mL (not administered)  dexamethasone (DECADRON) injection 12 mg (not administered)  famotidine (PEPCID) IVPB 20 mg (not administered)    DIAGNOSTIC STUDIES: Oxygen Saturation is 100% on room air, normal by my interpretation.    COORDINATION OF CARE: 12:02 PM-Advised pt that her symptoms are most likely anaphylactic shock Discussed treatment plan which includes medications with pt at bedside and pt agreed to plan. BP is 125/76.  2:56 PM-Pt rechecked and feels improved. Symptoms have improved on re-exam. Discussed discharge plan with pt and pt agreed to plan. Also advised pt to follow up as needed and pt agreed. Addressed symptoms to return for with pt.   Labs Review Labs Reviewed - No data to display Imaging Review No results found.  EKG Interpretation   None       MDM   1. Allergic reaction, initial encounter    27yF with allergic reaction. Urticarial rash and GI symptoms. No hypotension. Symptoms now resolved. Observed for several hours. DC'd with epipens and discussed indication for usage. Outpt FU.  I personally preformed the services scribed in my presence. The recorded information has been reviewed is accurate. [name], MD.    [name], MD 04/21/13 [phone]

## 2013-05-11 ENCOUNTER — Ambulatory Visit: Payer: Managed Care, Other (non HMO) | Admitting: Adult Health

## 2013-05-16 ENCOUNTER — Ambulatory Visit (INDEPENDENT_AMBULATORY_CARE_PROVIDER_SITE_OTHER): Payer: Medicaid Other | Admitting: Adult Health

## 2013-05-16 ENCOUNTER — Encounter: Payer: Self-pay | Admitting: Adult Health

## 2013-05-16 VITALS — BP 120/80 | Ht 68.0 in | Wt 198.0 lb

## 2013-05-16 DIAGNOSIS — Z3049 Encounter for surveillance of other contraceptives: Secondary | ICD-10-CM

## 2013-05-16 DIAGNOSIS — Z3041 Encounter for surveillance of contraceptive pills: Secondary | ICD-10-CM | POA: Insufficient documentation

## 2013-05-16 DIAGNOSIS — Z309 Encounter for contraceptive management, unspecified: Secondary | ICD-10-CM

## 2013-05-16 DIAGNOSIS — Z113 Encounter for screening for infections with a predominantly sexual mode of transmission: Secondary | ICD-10-CM

## 2013-05-16 DIAGNOSIS — Z3202 Encounter for pregnancy test, result negative: Secondary | ICD-10-CM

## 2013-05-16 HISTORY — DX: Encounter for contraceptive management, unspecified: Z30.9

## 2013-05-16 HISTORY — DX: Encounter for screening for infections with a predominantly sexual mode of transmission: Z11.3

## 2013-05-16 LAB — POCT WET PREP (WET MOUNT)
Trichomonas Wet Prep HPF POC: NEGATIVE
WBC, Wet Prep HPF POC: POSITIVE

## 2013-05-16 LAB — HEPATITIS B SURFACE ANTIGEN: Hepatitis B Surface Ag: NEGATIVE

## 2013-05-16 LAB — HEPATITIS C ANTIBODY: HCV Ab: NEGATIVE

## 2013-05-16 LAB — HIV ANTIBODY (ROUTINE TESTING W REFLEX): HIV: NONREACTIVE

## 2013-05-16 MED ORDER — NORGESTIM-ETH ESTRAD TRIPHASIC 0.18/0.215/0.25 MG-25 MCG PO TABS: 1.0000 | ORAL_TABLET | Freq: Every day | ORAL | Status: DC

## 2013-05-16 NOTE — Patient Instructions (Signed)
Use condoms Start new pills after finished with these Call in am 12/10

## 2013-05-16 NOTE — Progress Notes (Signed)
Subjective:     Patient ID: KARESSA ONORATO, female   DOB: 07-21-1985, 27 y.o.   MRN: [id number]  HPI Jewelia is a 27 year old black female in wanting STD testing and complains of spotting the week before her period, taking Lo loestrin.  Review of Systems See HPI Reviewed past medical,surgical, social and family history. Reviewed medications and allergies.     Objective:   Physical Exam BP 120/80  Ht 5\' 8"  (1.727 m)  Wt 198 lb (89.812 kg)  BMI 30.11 kg/m2  LMP 03/27/2013  Breastfeeding? NoUrine pregnancy test negative.   Skin warm and dry.Pelvic: external genitalia is normal in appearance, vagina: pinkish brown discharge without odor, cervix:smooth and bulbous, uterus: normal size, shape and contour, non tender, no masses felt, adnexa: no masses or tenderness noted. Wet prep: + for RBCs and +WBCs. GC/CHL obtained.  Assessment:     STD testing Contraceptive management    Plan:     Check HIV,RPR,HSV 2 and HEPT B&C and GC/CHL Finish current pills then start Tri Sprintec, rx x 1 year, use condoms Labs available in 24 - 48 hours

## 2013-05-17 LAB — HSV 2 ANTIBODY, IGG: HSV 2 Glycoprotein G Ab, IgG: <0.1 IV

## 2013-05-17 LAB — RPR

## 2013-05-18 ENCOUNTER — Telehealth: Payer: Self-pay | Admitting: Adult Health

## 2013-05-18 NOTE — Telephone Encounter (Signed)
Pt informed of negative STD testing from 05/16/13.

## 2013-08-12 ENCOUNTER — Emergency Department (HOSPITAL_COMMUNITY)
Admission: EM | Admit: 2013-08-12 | Discharge: 2013-08-12 | Disposition: A | Discharge status: Home / Self Care | Payer: Medicaid Other | Attending: Emergency Medicine | Admitting: Emergency Medicine

## 2013-08-12 ENCOUNTER — Encounter (HOSPITAL_COMMUNITY): Payer: Self-pay | Admitting: Emergency Medicine

## 2013-08-12 DIAGNOSIS — Z79899 Other long term (current) drug therapy: Secondary | ICD-10-CM | POA: Insufficient documentation

## 2013-08-12 DIAGNOSIS — R221 Localized swelling, mass and lump, neck: Secondary | ICD-10-CM

## 2013-08-12 DIAGNOSIS — Z792 Long term (current) use of antibiotics: Secondary | ICD-10-CM | POA: Insufficient documentation

## 2013-08-12 DIAGNOSIS — L509 Urticaria, unspecified: Secondary | ICD-10-CM | POA: Insufficient documentation

## 2013-08-12 DIAGNOSIS — J3489 Other specified disorders of nose and nasal sinuses: Secondary | ICD-10-CM | POA: Insufficient documentation

## 2013-08-12 DIAGNOSIS — Z872 Personal history of diseases of the skin and subcutaneous tissue: Secondary | ICD-10-CM | POA: Insufficient documentation

## 2013-08-12 DIAGNOSIS — T7840XA Allergy, unspecified, initial encounter: Secondary | ICD-10-CM

## 2013-08-12 DIAGNOSIS — Z8679 Personal history of other diseases of the circulatory system: Secondary | ICD-10-CM | POA: Insufficient documentation

## 2013-08-12 DIAGNOSIS — T4995XA Adverse effect of unspecified topical agent, initial encounter: Secondary | ICD-10-CM | POA: Insufficient documentation

## 2013-08-12 DIAGNOSIS — R220 Localized swelling, mass and lump, head: Secondary | ICD-10-CM | POA: Insufficient documentation

## 2013-08-12 MED ORDER — DIPHENHYDRAMINE HCL 50 MG/ML IJ SOLN
25.0000 mg | Freq: Once | INTRAMUSCULAR | Status: AC
  Administered 2013-08-12: 25 mg via INTRAVENOUS
  Filled 2013-08-12: qty 1

## 2013-08-12 MED ORDER — METHYLPREDNISOLONE SODIUM SUCC 125 MG IJ SOLR
125.0000 mg | Freq: Once | INTRAMUSCULAR | Status: AC
  Administered 2013-08-12: 125 mg via INTRAVENOUS
  Filled 2013-08-12: qty 2

## 2013-08-12 MED ORDER — PREDNISONE 50 MG PO TABS: 50.0000 mg | ORAL_TABLET | Freq: Every day | ORAL | Status: DC

## 2013-08-12 MED ORDER — SODIUM CHLORIDE 0.9 % IV BOLUS (SEPSIS)
1000.0000 mL | Freq: Once | INTRAVENOUS | Status: AC
  Administered 2013-08-12: 1000 mL via INTRAVENOUS

## 2013-08-12 MED ORDER — DIPHENHYDRAMINE HCL 25 MG PO TABS: 50.0000 mg | ORAL_TABLET | Freq: Four times a day (QID) | ORAL | Status: DC

## 2013-08-12 MED ORDER — EPINEPHRINE 0.3 MG/0.3ML IJ SOAJ: 0.3000 mg | INTRAMUSCULAR | Status: DC | PRN

## 2013-08-12 MED ORDER — FAMOTIDINE IN NACL 20-0.9 MG/50ML-% IV SOLN
20.0000 mg | Freq: Once | INTRAVENOUS | Status: AC
  Administered 2013-08-12: 20 mg via INTRAVENOUS
  Filled 2013-08-12: qty 50

## 2013-08-12 NOTE — ED Notes (Signed)
States she ate spaghetti last night feeling fine and went to bed. Did take some zyrtec this morning for itching. States this gradually came on about an hour after waking up at 0700.

## 2013-08-12 NOTE — Discharge Instructions (Signed)
Return to the ED with any concerns including difficulty breathing, lip or tongue swelling, vomiting, fainting, decreased level of alertness/lethargy, or any other alarming symptoms °

## 2013-08-12 NOTE — ED Notes (Signed)
Pt sent from Dr Parke SimmersBland office for further eval of allergic reaction. Pt presents with facial swelling and elevated HR 131. Pt reports + SOB. Pt unsure what is causing the reaction.

## 2013-08-12 NOTE — ED Provider Notes (Signed)
CSN: [id number]     Arrival date & time 08/12/13  1030 History   First MD Initiated Contact with Patient 08/12/13 1043     Chief Complaint  Patient presents with  . Allergic Reaction     (Consider location/radiation/quality/duration/timing/severity/associated sxs/prior Treatment) HPI Pt presenting with c/o hives and facial swelling.  She states symptoms began this morning approx 1 hour after waking up at 7am.  She has had prior allergic reactions, but not as significant as this.  No difficulty breathing but her nose feels congested.  She tried taking a zyrtec without much relief.  No new exposures, did not eat anything this morning.  She was seen at her doctor's office and referred to come to the ED for further treatment.  Symptoms have been gradually worsening over the past 2 hours.  No vomiting, no syncope.  There are no other associated systemic symptoms, there are no other alleviating or modifying factors.   Past Medical History  Diagnosis Date  . Headache(784.0)     migraines with aura  . Psoriasis   . Abnormal Pap smear   . Screening for STD (sexually transmitted disease) 05/16/2013  . Contraceptive management 05/16/2013   Past Surgical History  Procedure Laterality Date  . No past surgeries    . Colposcopy w/ biopsy / curettage     Family History  Problem Relation Age of Onset  . Diabetes Other   . Hypertension Other   . Coronary artery disease Paternal Grandfather   . Coronary artery disease Paternal Grandmother   . Hypertension Maternal Grandmother   . Cancer Maternal Grandfather     stomach  . Bipolar disorder Sister   . Bipolar disorder Sister   . Cancer Maternal Uncle     prostate  . Multiple sclerosis Maternal Aunt    History  Substance Use Topics  . Smoking status: Never Smoker   . Smokeless tobacco: Never Used     Comment: never used snuff or chewing tobacco.  . Alcohol Use: No   OB History   Grav Para Term Preterm Abortions TAB SAB Ect Mult Living   1 1  1       1      Review of Systems ROS reviewed and all otherwise negative except for mentioned in HPI    Allergies  Ciprofloxacin  Home Medications   Current Outpatient Rx  Name  Route  Sig  Dispense  Refill  . clindamycin-benzoyl peroxide (BENZACLIN) gel   Topical   Apply 1 application topically 2 (two) times daily.         Marland Kitchen. EPINEPHrine (EPIPEN) 0.3 mg/0.3 mL SOAJ injection   Intramuscular   Inject 0.3 mLs (0.3 mg total) into the muscle as needed.   2 Device   0   . Norgestimate-Ethinyl Estradiol Triphasic 0.18/0.215/0.25 MG-25 MCG tab   Oral   Take 1 tablet by mouth daily.   1 Package   11   . diphenhydrAMINE (BENADRYL) 25 MG tablet   Oral   Take 2 tablets (50 mg total) by mouth every 6 (six) hours. Take 1-2 tablets every 6 hours x 2 days, then space out to an as needed basis   20 tablet   0   . EPINEPHrine (EPIPEN) 0.3 mg/0.3 mL SOAJ injection   Intramuscular   Inject 0.3 mLs (0.3 mg total) into the muscle as needed.   1 Device   0   . predniSONE (DELTASONE) 50 MG tablet   Oral   Take 1 tablet (  50 mg total) by mouth daily.   4 tablet   0    BP 115/74  Pulse 116  Temp(Src) 98.1 F (36.7 C) (Oral)  Resp 20  SpO2 99% Vitals reivewed Physical Exam Physical Examination: General appearance - alert, well appearing, and in no distress Mental status - alert, oriented to person, place, and time Eyes - no conjunctival injection, no scleral icterus, eyelid swelling Mouth - mucous membranes moist, pharynx normal without lesions, lip swelling, no tongue swelling Neck - supple, no significant adenopathy Chest - clear to auscultation, no wheezes, rales or rhonchi, symmetric air entry Heart - normal rate, regular rhythm, normal S1, S2, no murmurs, rubs, clicks or gallops Abdomen - soft, nontender, nondistended, no masses or organomegaly Extremities - peripheral pulses normal, no pedal edema, no clubbing or cyanosis Skin - diffuse hives overlying extremities,  chest wall  ED Course  Procedures (including critical care time) 12:05 PM on recheck pt has improvement in her symptoms, still some mild facial swelling but improved from prior.  Her HR is 92 decreased from 130s.  Labs Review Labs Reviewed - No data to display Imaging Review No results found.   EKG Interpretation None      MDM   Final diagnoses:  Allergic reaction    Pt presenting with c/o allergic reaction with facial swelling, hives- no shortness of breath, no airway involvement or wheezing, pt treated with benadryl, steriods, pepcid- she did have improvement in her symptoms- mild swelling of lips remained- re-dosed with benadryl.  Pt stable for discharge. Home to take benadryl and steroids.  Also given rx for epipen.  Discharged with strict return precautions.  Pt agreeable with plan.    Ethelda Chick, MD 08/12/13 1600

## 2013-08-12 NOTE — ED Notes (Signed)
Dr. Linker at the bedside.  

## 2013-08-24 ENCOUNTER — Encounter: Payer: Self-pay | Admitting: Obstetrics & Gynecology

## 2013-08-24 ENCOUNTER — Ambulatory Visit (INDEPENDENT_AMBULATORY_CARE_PROVIDER_SITE_OTHER): Payer: Medicaid Other | Admitting: Obstetrics & Gynecology

## 2013-08-24 VITALS — BP 120/78 | Ht 68.0 in | Wt 183.0 lb

## 2013-08-24 DIAGNOSIS — N760 Acute vaginitis: Secondary | ICD-10-CM

## 2013-08-24 MED ORDER — METRONIDAZOLE 500 MG PO TABS: 500.0000 mg | ORAL_TABLET | Freq: Two times a day (BID) | ORAL | Status: DC

## 2013-08-24 NOTE — Progress Notes (Signed)
Patient ID: Karina Clayton, female   DOB: 11/17/1985, 28 y.o.   MRN: [id number] Pt with some discharge with odor minimal itching  Has had BV 4 times or so before  Wet prep +BV No yeast  No trich  No WBC  flagyul 500 bid x 7 days

## 2013-10-18 ENCOUNTER — Ambulatory Visit (INDEPENDENT_AMBULATORY_CARE_PROVIDER_SITE_OTHER): Payer: 59 | Admitting: Obstetrics & Gynecology

## 2013-10-18 ENCOUNTER — Encounter: Payer: Self-pay | Admitting: Obstetrics & Gynecology

## 2013-10-18 ENCOUNTER — Other Ambulatory Visit (HOSPITAL_COMMUNITY)
Admission: RE | Admit: 2013-10-18 | Discharge: 2013-10-18 | Disposition: A | Discharge status: Home / Self Care | Payer: 59 | Source: Ambulatory Visit | Attending: Obstetrics & Gynecology | Admitting: Obstetrics & Gynecology

## 2013-10-18 VITALS — BP 120/80 | Ht 68.0 in | Wt 179.0 lb

## 2013-10-18 DIAGNOSIS — Z01419 Encounter for gynecological examination (general) (routine) without abnormal findings: Secondary | ICD-10-CM

## 2013-10-18 MED ORDER — DESOGESTREL-ETHINYL ESTRADIOL 0.15-30 MG-MCG PO TABS: 1.0000 | ORAL_TABLET | Freq: Every day | ORAL | Status: DC

## 2013-10-18 NOTE — Addendum Note (Signed)
Addended by: Criss AlvinePULLIAM, Sherly Brodbeck G on: 10/18/2013 12:07 PM   Modules accepted: Orders

## 2013-10-18 NOTE — Progress Notes (Signed)
Patient ID: Karina Clayton, female   DOB: 01/28/1986, 28 y.o.   MRN: [id number] Subjective:     Karina Poloshley D Sakai is a 28 y.o. female here for a routine exam.  Patient's last menstrual period was 10/03/2013. G1P1001 Birth Control Method:  OCP  Menstrual Calendar(currently): regular  Current complaints: none.   Current acute medical issues:  none   Recent Gynecologic History Patient's last menstrual period was 10/03/2013. Last Pap: 2014,  normal Last mammogram: ,    Past Medical History  Diagnosis Date  . Headache(784.0)     migraines with aura  . Psoriasis   . Abnormal Pap smear   . Screening for STD (sexually transmitted disease) 05/16/2013  . Contraceptive management 05/16/2013    Past Surgical History  Procedure Laterality Date  . No past surgeries    . Colposcopy w/ biopsy / curettage      OB History   Grav Para Term Preterm Abortions TAB SAB Ect Mult Living   1 1 1       1       History   Social History  . Marital Status: Single    Spouse Name: N/A    Number of Children: N/A  . Years of Education: N/A   Social History Main Topics  . Smoking status: Never Smoker   . Smokeless tobacco: Never Used     Comment: never used snuff or chewing tobacco.  . Alcohol Use: No  . Drug Use: No  . Sexual Activity: Yes    Birth Control/ Protection: Pill   Other Topics Concern  . None   Social History Narrative  . None    Family History  Problem Relation Age of Onset  . Diabetes Other   . Hypertension Other   . Coronary artery disease Paternal Grandfather   . Coronary artery disease Paternal Grandmother   . Hypertension Maternal Grandmother   . Cancer Maternal Grandfather     stomach  . Bipolar disorder Sister   . Bipolar disorder Sister   . Cancer Maternal Uncle     prostate  . Multiple sclerosis Maternal Aunt      Review of Systems  Review of Systems  Constitutional: Negative for fever, chills, weight loss, malaise/fatigue and diaphoresis.  HENT:  Negative for hearing loss, ear pain, nosebleeds, congestion, sore throat, neck pain, tinnitus and ear discharge.   Eyes: Negative for blurred vision, double vision, photophobia, pain, discharge and redness.  Respiratory: Negative for cough, hemoptysis, sputum production, shortness of breath, wheezing and stridor.   Cardiovascular: Negative for chest pain, palpitations, orthopnea, claudication, leg swelling and PND.  Gastrointestinal: negative for abdominal pain. Negative for heartburn, nausea, vomiting, diarrhea, constipation, blood in stool and melena.  Genitourinary: Negative for dysuria, urgency, frequency, hematuria and flank pain.  Musculoskeletal: Negative for myalgias, back pain, joint pain and falls.  Skin: Negative for itching and rash.  Neurological: Negative for dizziness, tingling, tremors, sensory change, speech change, focal weakness, seizures, loss of consciousness, weakness and headaches.  Endo/Heme/Allergies: Negative for environmental allergies and polydipsia. Does not bruise/bleed easily.  Psychiatric/Behavioral: Negative for depression, suicidal ideas, hallucinations, memory loss and substance abuse. The patient is not nervous/anxious and does not have insomnia.        Objective:    Physical Exam  Vitals reviewed. Constitutional: She is oriented to person, place, and time. She appears well-developed and well-nourished.  HENT:  Head: Normocephalic and atraumatic.        Right Ear: External ear normal.  Left  Ear: External ear normal.  Nose: Nose normal.  Mouth/Throat: Oropharynx is clear and moist.  Eyes: Conjunctivae and EOM are normal. Pupils are equal, round, and reactive to light. Right eye exhibits no discharge. Left eye exhibits no discharge. No scleral icterus.  Neck: Normal range of motion. Neck supple. No tracheal deviation present. No thyromegaly present.  Cardiovascular: Normal rate, regular rhythm, normal heart sounds and intact distal pulses.  Exam reveals no  gallop and no friction rub.   No murmur heard. Respiratory: Effort normal and breath sounds normal. No respiratory distress. She has no wheezes. She has no rales. She exhibits no tenderness.  GI: Soft. Bowel sounds are normal. She exhibits no distension and no mass. There is no tenderness. There is no rebound and no guarding.  Genitourinary:  Breasts no masses skin changes or nipple changes bilaterally      Vulva is normal without lesions Vagina is pink moist without discharge Cervix normal in appearance and pap is done Uterus is normal size shape and contour Adnexa is negative with normal sized ovaries   Musculoskeletal: Normal range of motion. She exhibits no edema and no tenderness.  Neurological: She is alert and oriented to person, place, and time. She has normal reflexes. She displays normal reflexes. No cranial nerve deficit. She exhibits normal muscle tone. Coordination normal.  Skin: Skin is warm and dry. No rash noted. No erythema. No pallor.  Psychiatric: She has a normal mood and affect. Her behavior is normal. Judgment and thought content normal.       Assessment:    Healthy female exam.    Plan:    Contraception: OCP (estrogen/progesterone). Follow up in: 1 year.

## 2013-10-24 ENCOUNTER — Other Ambulatory Visit: Payer: Medicaid Other | Admitting: Obstetrics & Gynecology

## 2013-11-17 ENCOUNTER — Other Ambulatory Visit: Payer: Self-pay | Admitting: *Deleted

## 2013-11-30 MED ORDER — DESOGESTREL-ETHINYL ESTRADIOL 0.15-30 MG-MCG PO TABS: 1.0000 | ORAL_TABLET | Freq: Every day | ORAL | Status: DC

## 2013-12-21 ENCOUNTER — Ambulatory Visit (INDEPENDENT_AMBULATORY_CARE_PROVIDER_SITE_OTHER): Payer: 59 | Admitting: Advanced Practice Midwife

## 2013-12-21 ENCOUNTER — Encounter: Payer: Self-pay | Admitting: Advanced Practice Midwife

## 2013-12-21 VITALS — BP 140/84 | Ht 68.0 in | Wt 185.0 lb

## 2013-12-21 DIAGNOSIS — N390 Urinary tract infection, site not specified: Secondary | ICD-10-CM

## 2013-12-21 DIAGNOSIS — N898 Other specified noninflammatory disorders of vagina: Secondary | ICD-10-CM

## 2013-12-21 LAB — POCT URINALYSIS DIPSTICK
GLUCOSE UA: NEGATIVE
KETONES UA: NEGATIVE
Leukocytes, UA: NEGATIVE
Nitrite, UA: POSITIVE
Protein, UA: NEGATIVE
RBC UA: NEGATIVE

## 2013-12-21 MED ORDER — SULFAMETHOXAZOLE-TMP DS 800-160 MG PO TABS: 1.0000 | ORAL_TABLET | Freq: Two times a day (BID) | ORAL | Status: DC

## 2013-12-21 NOTE — Progress Notes (Signed)
[facility] Visit  Patient name: Karina Clayton MRN [id number]  Date of birth: 04/28/1986  CC & HPI:  Karina Clayton is a 28 y.o. Caucasian female presenting today for C/O odor when voiding.  No dysuria, burning or irritaiton.  Requests Gc/CHL testing  Pertinent History Reviewed:  Medical & Surgical Hx:   Past Medical History  Diagnosis Date  . Headache(784.0)     migraines with aura  . Psoriasis   . Abnormal Pap smear   . Screening for STD (sexually transmitted disease) 05/16/2013  . Contraceptive management 05/16/2013   Past Surgical History  Procedure Laterality Date  . No past surgeries    . Colposcopy w/ biopsy / curettage     Medications: Reviewed & Updated - see associated section Social History: Reviewed -  reports that she has never smoked. She has never used smokeless tobacco.  Objective Findings:  Vitals: BP 140/84  Ht 5\' 8"  (1.727 m)  Wt 185 lb (83.915 kg)  BMI 28.14 kg/m2  LMP 11/29/2013  Physical Examination: General appearance - alert, well appearing, and in no distress Mental status - alert, oriented to person, place, and time Pelvic - normal external genitalia, vulva, vagina, cervix; discharge appears normal without odor.  Wet prep negative Back exam - no CVAT  Results for orders placed in visit on 12/21/13 (from the past 24 hour(s))  POCT URINALYSIS DIPSTICK   Collection Time    12/21/13  4:07 PM      Result Value Ref Range   Color, UA yellow     Clarity, UA cloudy     Glucose, UA neg     Bilirubin, UA       Ketones, UA neg     Spec Grav, UA       Blood, UA neg     pH, UA       Protein, UA neg     Urobilinogen, UA       Nitrite, UA positive     Leukocytes, UA Negative       Assessment & Plan:  A:   UTI  STD screening P:  Meds ordered this encounter  Medications  . sulfamethoxazole-trimethoprim (BACTRIM DS) 800-160 MG per tablet    Sig: Take 1 tablet by mouth 2 (two) times daily.    Dispense:  10 tablet    Refill:  0   Order Specific Question:  Supervising Provider    Answer:  Lazaro ArmsEURE, LUTHER H [[id number]]     Culture urine   F/U prn  CRESENZO-DISHMAN,Wilberth Damon CNM 12/21/2013 4:13 PM

## 2013-12-22 LAB — GC/CHLAMYDIA PROBE AMP
CT PROBE, AMP APTIMA: NEGATIVE
GC PROBE AMP APTIMA: NEGATIVE

## 2013-12-23 ENCOUNTER — Telehealth: Payer: Self-pay | Admitting: Obstetrics and Gynecology

## 2013-12-23 NOTE — Telephone Encounter (Signed)
Unavailable @ 1:14pm. JSY

## 2013-12-26 ENCOUNTER — Telehealth: Payer: Self-pay | Admitting: Adult Health

## 2013-12-26 ENCOUNTER — Other Ambulatory Visit: Payer: Self-pay | Admitting: Women's Health

## 2013-12-26 MED ORDER — NITROFURANTOIN MONOHYD MACRO 100 MG PO CAPS: 100.0000 mg | ORAL_CAPSULE | Freq: Two times a day (BID) | ORAL | Status: DC

## 2013-12-26 NOTE — Telephone Encounter (Signed)
Spoke with pt. Pt saw Drenda FreezeFran last week and was prescribed Bactrim. Pt has had an allergic reaction to this med, hives, sneezing, itchy watery eyes. She has stopped med but needs something in place of it. Please advise. Thanks!!! [employer]abody EnergyJSY

## 2013-12-26 NOTE — Telephone Encounter (Signed)
Unavailable @ 9:22 am. JSY

## 2013-12-26 NOTE — Telephone Encounter (Signed)
Spoke with pt. Encounter closed. JSY °

## 2013-12-26 NOTE — Telephone Encounter (Signed)
Left message x 1. JSY 

## 2014-04-10 ENCOUNTER — Encounter: Payer: Self-pay | Admitting: Advanced Practice Midwife

## 2014-04-12 ENCOUNTER — Ambulatory Visit (INDEPENDENT_AMBULATORY_CARE_PROVIDER_SITE_OTHER): Payer: 59 | Admitting: Adult Health

## 2014-04-12 ENCOUNTER — Encounter: Payer: Self-pay | Admitting: Adult Health

## 2014-04-12 VITALS — BP 140/72 | Ht 68.0 in | Wt 186.0 lb

## 2014-04-12 DIAGNOSIS — B9689 Other specified bacterial agents as the cause of diseases classified elsewhere: Secondary | ICD-10-CM

## 2014-04-12 DIAGNOSIS — N898 Other specified noninflammatory disorders of vagina: Secondary | ICD-10-CM | POA: Insufficient documentation

## 2014-04-12 DIAGNOSIS — Z113 Encounter for screening for infections with a predominantly sexual mode of transmission: Secondary | ICD-10-CM

## 2014-04-12 DIAGNOSIS — A499 Bacterial infection, unspecified: Secondary | ICD-10-CM

## 2014-04-12 DIAGNOSIS — N760 Acute vaginitis: Secondary | ICD-10-CM | POA: Insufficient documentation

## 2014-04-12 HISTORY — DX: Other specified bacterial agents as the cause of diseases classified elsewhere: N76.0

## 2014-04-12 HISTORY — DX: Other specified noninflammatory disorders of vagina: N89.8

## 2014-04-12 HISTORY — DX: Other specified bacterial agents as the cause of diseases classified elsewhere: B96.89

## 2014-04-12 LAB — POCT WET PREP (WET MOUNT)
TRICHOMONAS WET PREP HPF POC: NEGATIVE
WBC, Wet Prep HPF POC: POSITIVE

## 2014-04-12 MED ORDER — METRONIDAZOLE 500 MG PO TABS: 500.0000 mg | ORAL_TABLET | Freq: Two times a day (BID) | ORAL | Status: DC

## 2014-04-12 NOTE — Progress Notes (Signed)
Subjective:     Patient ID: Cherlynn Poloshley D Stefanski, female   DOB: 12/07/1985, 28 y.o.   MRN: [id number]  HPI Morrie Sheldonshley is a 28 year old black female in complaining of vaginal discharge with odor and wants STD testing.Uses vagisil  rephresh to bathe and no new partners.  Review of Systems See HPI Reviewed past medical,surgical, social and family history. Reviewed medications and allergies.     Objective:   Physical Exam BP 140/72 mmHg  Ht 5\' 8"  (1.727 m)  Wt 186 lb (84.369 kg)  BMI 28.29 kg/m2  LMP 03/29/2014  Breastfeeding? No Skin warm and dry.Pelvic: external genitalia is normal in appearance, vagina: white discharge with odor, cervix:smooth and bulbous, uterus: normal size, shape and contour, non tender, no masses felt, adnexa: no masses or tenderness noted. Wet prep: + for clue cells and +WBCs. GC/CHL obtained.     Assessment:     Vaginal discharge with odor BV STD screening    Plan:     Rx flagyl 500 mg 1 bid x 7 days, with 1 refill, no alcohol, review handout on BV   Check GC/CHL,HIV,RPR,HSV 2 Follow up prn

## 2014-04-12 NOTE — Patient Instructions (Signed)
Bacterial Vaginosis Bacterial vaginosis is a vaginal infection that occurs when the normal balance of bacteria in the vagina is disrupted. It results from an overgrowth of certain bacteria. This is the most common vaginal infection in women of childbearing age. Treatment is important to prevent complications, especially in pregnant women, as it can cause a premature delivery. CAUSES  Bacterial vaginosis is caused by an increase in harmful bacteria that are normally present in smaller amounts in the vagina. Several different kinds of bacteria can cause bacterial vaginosis. However, the reason that the condition develops is not fully understood. RISK FACTORS Certain activities or behaviors can put you at an increased risk of developing bacterial vaginosis, including:  Having a new sex partner or multiple sex partners.  Douching.  Using an intrauterine device (IUD) for contraception. Women do not get bacterial vaginosis from toilet seats, bedding, swimming pools, or contact with objects around them. SIGNS AND SYMPTOMS  Some women with bacterial vaginosis have no signs or symptoms. Common symptoms include:  Grey vaginal discharge.  A fishlike odor with discharge, especially after sexual intercourse.  Itching or burning of the vagina and vulva.  Burning or pain with urination. DIAGNOSIS  Your health care provider will take a medical history and examine the vagina for signs of bacterial vaginosis. A sample of vaginal fluid may be taken. Your health care provider will look at this sample under a microscope to check for bacteria and abnormal cells. A vaginal pH test may also be done.  TREATMENT  Bacterial vaginosis may be treated with antibiotic medicines. These may be given in the form of a pill or a vaginal cream. A second round of antibiotics may be prescribed if the condition comes back after treatment.  HOME CARE INSTRUCTIONS   Only take over-the-counter or prescription medicines as  directed by your health care provider.  If antibiotic medicine was prescribed, take it as directed. Make sure you finish it even if you start to feel better.  Do not have sex until treatment is completed.  Tell all sexual partners that you have a vaginal infection. They should see their health care provider and be treated if they have problems, such as a mild rash or itching.  Practice safe sex by using condoms and only having one sex partner. SEEK MEDICAL CARE IF:   Your symptoms are not improving after 3 days of treatment.  You have increased discharge or pain.  You have a fever. MAKE SURE YOU:   Understand these instructions.  Will watch your condition.  Will get help right away if you are not doing well or get worse. FOR MORE INFORMATION  Centers for Disease Control and Prevention, Division of STD Prevention: SolutionApps.co.zawww.cdc.gov/std American Sexual Health Association (ASHA): www.ashastd.org  Document Released: 05/26/2005 Document Revised: 03/16/2013 Document Reviewed: 01/05/2013 [facility] Patient Information 2015 [place], [place]. This information is not intended to replace advice given to you by your health care provider. Make sure you discuss any questions you have with your health care provider. Take flagyl 1 bid x 7 days no alcohol Follow up prn

## 2014-04-13 LAB — RPR

## 2014-04-13 LAB — GC/CHLAMYDIA PROBE AMP
CT Probe RNA: NEGATIVE
GC Probe RNA: NEGATIVE

## 2014-04-13 LAB — HIV ANTIBODY (ROUTINE TESTING W REFLEX): HIV: NONREACTIVE

## 2014-04-14 ENCOUNTER — Telehealth: Payer: Self-pay | Admitting: Adult Health

## 2014-04-14 LAB — HSV 2 ANTIBODY, IGG

## 2014-04-14 NOTE — Telephone Encounter (Signed)
Spoke with pt letting her know GC/CHL, HIV, RPR were all negative. HSV is still pending. Advised pt to call back Monday and we should have final result then. Pt voiced understanding. JSY

## 2014-04-14 NOTE — Telephone Encounter (Signed)
Left message x 1. JSY 

## 2014-08-23 ENCOUNTER — Encounter: Payer: Self-pay | Admitting: Advanced Practice Midwife

## 2014-08-23 ENCOUNTER — Ambulatory Visit (INDEPENDENT_AMBULATORY_CARE_PROVIDER_SITE_OTHER): Payer: Medicaid Other | Admitting: Advanced Practice Midwife

## 2014-08-23 VITALS — BP 136/84 | HR 92 | Ht 67.75 in | Wt 184.0 lb

## 2014-08-23 DIAGNOSIS — Z113 Encounter for screening for infections with a predominantly sexual mode of transmission: Secondary | ICD-10-CM

## 2014-08-23 NOTE — Progress Notes (Signed)
[facility] Visit  Patient name: Karina Clayton D Ocain MRN [id number]  Date of birth: 11/11/1985  CC & HPI:  Karina Clayton D Fok is a 29 y.o. African American female presenting today for C/O vaginal discharge.  She noticed a fishy smell 4 days ago, and started using OTC vaginal suppositories. Denies itching or irritation.  Fishy odor is gone, but still notices increased discharge.  Requests "full STD screening".   Pertinent History Reviewed:  Medical & Surgical Hx:   Past Medical History  Diagnosis Date  . Headache(784.0)     migraines with aura  . Psoriasis   . Abnormal Pap smear   . Screening for STD (sexually transmitted disease) 05/16/2013  . Contraceptive management 05/16/2013  . Eczema   . Vaginal Pap smear, abnormal   . Vaginal discharge 04/12/2014  . BV (bacterial vaginosis) 04/12/2014   Past Surgical History  Procedure Laterality Date  . No past surgeries    . Colposcopy w/ biopsy / curettage     Family History  Problem Relation Age of Onset  . Diabetes Other     paternal great uncle  . Coronary artery disease Paternal Grandfather   . Coronary artery disease Paternal Grandmother   . Hypertension Maternal Grandmother   . Cancer Maternal Grandfather     stomach  . Bipolar disorder Sister   . Hypertension Sister   . Bipolar disorder Sister   . Cancer Maternal Uncle     prostate  . Multiple sclerosis Maternal Aunt   . Hypertension Maternal Aunt     Current outpatient prescriptions:  .  desogestrel-ethinyl estradiol (APRI,EMOQUETTE,SOLIA) 0.15-30 MG-MCG tablet, Take 1 tablet by mouth daily., Disp: 1 Package, Rfl: 11 .  EPINEPHrine (EPIPEN) 0.3 mg/0.3 mL SOAJ injection, Inject 0.3 mLs (0.3 mg total) into the muscle as needed., Disp: 2 Device, Rfl: 0 .  diphenhydrAMINE (BENADRYL) 25 MG tablet, Take 2 tablets (50 mg total) by mouth every 6 (six) hours. Take 1-2 tablets every 6 hours x 2 days, then space out to an as needed basis (Patient not taking: Reported on  08/23/2014), Disp: 20 tablet, Rfl: 0 .  metroNIDAZOLE (FLAGYL) 500 MG tablet, Take 1 tablet (500 mg total) by mouth 2 (two) times daily. (Patient not taking: Reported on 08/23/2014), Disp: 14 tablet, Rfl: 1 .  triamcinolone cream (KENALOG) 0.1 %, 1 application as needed. , Disp: , Rfl: 3 Social History: Reviewed -  reports that she has never smoked. She has never used smokeless tobacco.  Review of Systems:   Constitutional: Negative for fever and chills Gastrointestinal: Negative for abdominal pain Genitourinary: Negative for dysuria and urgency, vaginal irritation or itching   Objective Findings:  Vitals: BP 136/84 mmHg  Pulse 92  Ht 5' 7.75" (1.721 m)  Wt 184 lb (83.462 kg)  BMI 28.18 kg/m2  LMP 08/01/2014  Physical Examination: General appearance - alert, well appearing, and in no distress Pelvic - Vulva normal in appearance. SSE: Vagina not erythemous, vaginal discharge is hard to assess d/t suppository still +, but no odor and normal amoutn.  Wet prep completely negative    No results found for this or any previous visit (from the past 24 hour(s)).        Assessment & Plan:  A:   Normal vaginal discharge  STD screening P:  GC/CHL, HSV2, RPR, HIV  CRESENZO-DISHMAN,Keona Bilyeu CNM 08/23/2014 4:19 PM

## 2014-08-24 LAB — RPR: RPR Ser Ql: NONREACTIVE

## 2014-08-24 LAB — HIV ANTIBODY (ROUTINE TESTING W REFLEX): HIV Screen 4th Generation wRfx: NONREACTIVE

## 2014-08-24 LAB — HSV 2 ANTIBODY, IGG: HSV 2 Glycoprotein G Ab, IgG: <0.91 index (ref 0.00–0.90)

## 2014-09-04 ENCOUNTER — Other Ambulatory Visit: Payer: Self-pay | Admitting: Obstetrics & Gynecology

## 2014-09-22 ENCOUNTER — Ambulatory Visit (INDEPENDENT_AMBULATORY_CARE_PROVIDER_SITE_OTHER): Payer: 59 | Admitting: Obstetrics & Gynecology

## 2014-09-22 ENCOUNTER — Encounter: Payer: Self-pay | Admitting: Obstetrics & Gynecology

## 2014-09-22 VITALS — BP 130/90 | Ht 67.0 in | Wt 179.0 lb

## 2014-09-22 DIAGNOSIS — B009 Herpesviral infection, unspecified: Secondary | ICD-10-CM

## 2014-09-22 MED ORDER — VALACYCLOVIR HCL 1 G PO TABS: 1000.0000 mg | ORAL_TABLET | Freq: Two times a day (BID) | ORAL | Status: DC

## 2014-09-22 MED ORDER — ACYCLOVIR 5 % EX OINT: 1.0000 "application " | TOPICAL_OINTMENT | CUTANEOUS | Status: DC

## 2014-09-22 NOTE — Progress Notes (Signed)
Patient ID: Karina Clayton Cantera, female   DOB: 01/09/1986, 29 y.o.   MRN: [id number]   Chief Complaint  Patient presents with  . gyn visit    sore outside of vagina     HPI:    29 y.o. G1P1001 Patient's last menstrual period was 09/04/2014.  Sore lesion on left 2 days ago, never had it before Location:  Left vulva. Quality:  burning. Severity:  Moderate, severe when gets wet. Timing:  No relation to menses Duration:  2 days. Context:  . Modifying factors:   Signs/Symptoms:      Current outpatient prescriptions:  .  diphenhydrAMINE (BENADRYL) 25 MG tablet, Take 2 tablets (50 mg total) by mouth every 6 (six) hours. Take 1-2 tablets every 6 hours x 2 days, then space out to an as needed basis, Disp: 20 tablet, Rfl: 0 .  ENSKYCE 0.15-30 MG-MCG tablet, Take 1 tablet by mouth  daily, Disp: 84 tablet, Rfl: 3 .  EPINEPHrine (EPIPEN) 0.3 mg/0.3 mL SOAJ injection, Inject 0.3 mLs (0.3 mg total) into the muscle as needed., Disp: 2 Device, Rfl: 0 .  desogestrel-ethinyl estradiol (APRI,EMOQUETTE,SOLIA) 0.15-30 MG-MCG tablet, Take 1 tablet by mouth daily. (Patient not taking: Reported on 09/22/2014), Disp: 1 Package, Rfl: 11 .  metroNIDAZOLE (FLAGYL) 500 MG tablet, Take 1 tablet (500 mg total) by mouth 2 (two) times daily. (Patient not taking: Reported on 08/23/2014), Disp: 14 tablet, Rfl: 1 .  triamcinolone cream (KENALOG) 0.1 %, 1 application as needed. , Disp: , Rfl: 3  Problem Pertinent ROS:       No burning with urination, frequency or urgency No nausea, vomiting or diarrhea Nor fever chills or other constitutional symptoms   Extended ROS:        PMFSH:             Past Medical History  Diagnosis Date  . Headache(784.0)     migraines with aura  . Psoriasis   . Abnormal Pap smear   . Screening for STD (sexually transmitted disease) 05/16/2013  . Contraceptive management 05/16/2013  . Eczema   . Vaginal Pap smear, abnormal   . Vaginal discharge 04/12/2014  . BV (bacterial vaginosis)  04/12/2014    Past Surgical History  Procedure Laterality Date  . No past surgeries    . Colposcopy w/ biopsy / curettage      OB History    Gravida Para Term Preterm AB TAB SAB Ectopic Multiple Living   1 1 1       1       Allergies  Allergen Reactions  . Ciprofloxacin Anaphylaxis, Hives and Itching  . Milk-Related Compounds Anaphylaxis  . Bactrim [Sulfamethoxazole-Trimethoprim] Hives    Itchy watery eyes    History   Social History  . Marital Status: Single    Spouse Name: N/A  . Number of Children: N/A  . Years of Education: N/A   Social History Main Topics  . Smoking status: Never Smoker   . Smokeless tobacco: Never Used     Comment: never used snuff or chewing tobacco.  . Alcohol Use: No  . Drug Use: No  . Sexual Activity: Yes    Birth Control/ Protection: Pill   Other Topics Concern  . None   Social History Narrative    Family History  Problem Relation Age of Onset  . Diabetes Other     paternal great uncle  . Coronary artery disease Paternal Grandfather   . Coronary artery disease Paternal Grandmother   .  Hypertension Maternal Grandmother   . Cancer Maternal Grandfather     stomach  . Bipolar disorder Sister   . Hypertension Sister   . Bipolar disorder Sister   . Cancer Maternal Uncle     prostate  . Multiple sclerosis Maternal Aunt   . Hypertension Maternal Aunt      Examination:  Vitals:  Blood pressure 130/90, height  (1.702 m), weight 179 lb (81.194 kg), last menstrual period 09/04/2014.    Physical Examination:     General Appearance:  well developed, well nourished and in no acute distress  Vulva:  lone, vulvar lesion left shallow ulcer consistent with HSV1 Vagina:  normal mucosa, no discharge Cervix:   Uterus:   Adnexa: ovaries:,       DATA orders and reviews: Labs were ordered today:  PCR for HSV Imaging studies were not ordered today:    Lab tests were reviewed today:   Former STI labs Imaging studies were not  reviewed today:    I did not independently review/view images, tracing or specimen(not simply the report) myself.  Prescription Drug Management:  New Prescriptions: Valtrex 1 gram TID x 10 days, topical acyclovir cream Renewed Prescriptions:   Current prescription changes:     Impression/Plan(Problem Based): 1.  HSV1 vulvar lesion      (new problem) : Additional workup is not needed:  HSV PCR pending  Follow Up:   prn

## 2014-09-25 LAB — HERPES SIMPLEX VIRUS CULTURE

## 2014-11-02 ENCOUNTER — Ambulatory Visit (INDEPENDENT_AMBULATORY_CARE_PROVIDER_SITE_OTHER): Payer: 59 | Admitting: Obstetrics & Gynecology

## 2014-11-02 ENCOUNTER — Encounter: Payer: Self-pay | Admitting: Obstetrics & Gynecology

## 2014-11-02 ENCOUNTER — Other Ambulatory Visit (HOSPITAL_COMMUNITY)
Admission: RE | Admit: 2014-11-02 | Discharge: 2014-11-02 | Disposition: A | Discharge status: Home / Self Care | Payer: Medicaid Other | Source: Ambulatory Visit | Attending: Obstetrics & Gynecology | Admitting: Obstetrics & Gynecology

## 2014-11-02 VITALS — BP 130/80 | HR 84 | Ht 67.4 in | Wt 176.0 lb

## 2014-11-02 DIAGNOSIS — Z01419 Encounter for gynecological examination (general) (routine) without abnormal findings: Secondary | ICD-10-CM

## 2014-11-02 NOTE — Progress Notes (Signed)
Patient ID: Karina Clayton, female   DOB: 07/04/1985, 29 y.o.   MRN: [id number] Subjective:     Karina Clayton is a 29 y.o. female here for a routine exam.  Patient's last menstrual period was 10/03/2014. G1P1001 Birth Control Method:  Desogestrel ocp Menstrual Calendar(currently): regular  Current complaints: none.   Current acute medical issues:  none   Recent Gynecologic History Patient's last menstrual period was 10/03/2014. Last Pap: 2015,  normal Last mammogram: ,    Past Medical History  Diagnosis Date  . Headache(784.0)     migraines with aura  . Psoriasis   . Abnormal Pap smear   . Screening for STD (sexually transmitted disease) 05/16/2013  . Contraceptive management 05/16/2013  . Eczema   . Vaginal Pap smear, abnormal   . Vaginal discharge 04/12/2014  . BV (bacterial vaginosis) 04/12/2014    Past Surgical History  Procedure Laterality Date  . No past surgeries    . Colposcopy w/ biopsy / curettage      OB History    Gravida Para Term Preterm AB TAB SAB Ectopic Multiple Living   1 1 1       1       History   Social History  . Marital Status: Single    Spouse Name: N/A  . Number of Children: N/A  . Years of Education: N/A   Social History Main Topics  . Smoking status: Never Smoker   . Smokeless tobacco: Never Used     Comment: never used snuff or chewing tobacco.  . Alcohol Use: No  . Drug Use: No  . Sexual Activity: Yes    Birth Control/ Protection: Pill   Other Topics Concern  . None   Social History Narrative    Family History  Problem Relation Age of Onset  . Diabetes Other     paternal great uncle  . Coronary artery disease Paternal Grandfather   . Coronary artery disease Paternal Grandmother   . Hypertension Maternal Grandmother   . Cancer Maternal Grandfather     stomach  . Bipolar disorder Sister   . Hypertension Sister   . Bipolar disorder Sister   . Cancer Maternal Uncle     prostate  . Multiple sclerosis Maternal Aunt   .  Hypertension Maternal Aunt      Current outpatient prescriptions:  .  cetirizine (ZYRTEC) 10 MG tablet, Take 10 mg by mouth daily., Disp: , Rfl:  .  desogestrel-ethinyl estradiol (APRI,EMOQUETTE,SOLIA) 0.15-30 MG-MCG tablet, Take 1 tablet by mouth daily., Disp: 1 Package, Rfl: 11 .  acyclovir ointment (ZOVIRAX) 5 %, Apply 1 application topically every 3 (three) hours. Apply up to 5 times daily (Patient not taking: Reported on 11/02/2014), Disp: 15 g, Rfl: 1 .  diphenhydrAMINE (BENADRYL) 25 MG tablet, Take 2 tablets (50 mg total) by mouth every 6 (six) hours. Take 1-2 tablets every 6 hours x 2 days, then space out to an as needed basis (Patient not taking: Reported on 11/02/2014), Disp: 20 tablet, Rfl: 0 .  ENSKYCE 0.15-30 MG-MCG tablet, Take 1 tablet by mouth  daily (Patient not taking: Reported on 11/02/2014), Disp: 84 tablet, Rfl: 3 .  EPINEPHrine (EPIPEN) 0.3 mg/0.3 mL SOAJ injection, Inject 0.3 mLs (0.3 mg total) into the muscle as needed. (Patient not taking: Reported on 11/02/2014), Disp: 2 Device, Rfl: 0 .  metroNIDAZOLE (FLAGYL) 500 MG tablet, Take 1 tablet (500 mg total) by mouth 2 (two) times daily. (Patient not taking: Reported on 08/23/2014), Disp:  14 tablet, Rfl: 1 .  triamcinolone cream (KENALOG) 0.1 %, 1 application as needed. , Disp: , Rfl: 3 .  valACYclovir (VALTREX) 1000 MG tablet, Take 1 tablet (1,000 mg total) by mouth 2 (two) times daily. (Patient not taking: Reported on 11/02/2014), Disp: 20 tablet, Rfl: 1  Review of Systems  Review of Systems  Constitutional: Negative for fever, chills, weight loss, malaise/fatigue and diaphoresis.  HENT: Negative for hearing loss, ear pain, nosebleeds, congestion, sore throat, neck pain, tinnitus and ear discharge.   Eyes: Negative for blurred vision, double vision, photophobia, pain, discharge and redness.  Respiratory: Negative for cough, hemoptysis, sputum production, shortness of breath, wheezing and stridor.   Cardiovascular: Negative for  chest pain, palpitations, orthopnea, claudication, leg swelling and PND.  Gastrointestinal: negative for abdominal pain. Negative for heartburn, nausea, vomiting, diarrhea, constipation, blood in stool and melena.  Genitourinary: Negative for dysuria, urgency, frequency, hematuria and flank pain.  Musculoskeletal: Negative for myalgias, back pain, joint pain and falls.  Skin: Negative for itching and rash.  Neurological: Negative for dizziness, tingling, tremors, sensory change, speech change, focal weakness, seizures, loss of consciousness, weakness and headaches.  Endo/Heme/Allergies: Negative for environmental allergies and polydipsia. Does not bruise/bleed easily.  Psychiatric/Behavioral: Negative for depression, suicidal ideas, hallucinations, memory loss and substance abuse. The patient is not nervous/anxious and does not have insomnia.        Objective:  Blood pressure 130/80, pulse 84, height 5' 7.4" (1.712 m), weight 176 lb (79.833 kg), last menstrual period 10/03/2014.   Physical Exam  Vitals reviewed. Constitutional: She is oriented to person, place, and time. She appears well-developed and well-nourished.  HENT:  Head: Normocephalic and atraumatic.        Right Ear: External ear normal.  Left Ear: External ear normal.  Nose: Nose normal.  Mouth/Throat: Oropharynx is clear and moist.  Eyes: Conjunctivae and EOM are normal. Pupils are equal, round, and reactive to light. Right eye exhibits no discharge. Left eye exhibits no discharge. No scleral icterus.  Neck: Normal range of motion. Neck supple. No tracheal deviation present. No thyromegaly present.  Cardiovascular: Normal rate, regular rhythm, normal heart sounds and intact distal pulses.  Exam reveals no gallop and no friction rub.   No murmur heard. Respiratory: Effort normal and breath sounds normal. No respiratory distress. She has no wheezes. She has no rales. She exhibits no tenderness.  GI: Soft. Bowel sounds are  normal. She exhibits no distension and no mass. There is no tenderness. There is no rebound and no guarding.  Genitourinary:  Breasts no masses skin changes or nipple changes bilaterally      Vulva is normal without lesions Vagina is pink moist without discharge Cervix normal in appearance and pap is done Uterus is normal size shape and contour Adnexa is negative with normal sized ovaries   Musculoskeletal: Normal range of motion. She exhibits no edema and no tenderness.  Neurological: She is alert and oriented to person, place, and time. She has normal reflexes. She displays normal reflexes. No cranial nerve deficit. She exhibits normal muscle tone. Coordination normal.  Skin: Skin is warm and dry. No rash noted. No erythema. No pallor.  Psychiatric: She has a normal mood and affect. Her behavior is normal. Judgment and thought content normal.       Assessment:    Healthy female exam.    Plan:    Contraception: OCP (estrogen/progesterone). Follow up in: 1 year.

## 2014-11-08 LAB — CYTOLOGY - PAP

## 2015-02-15 ENCOUNTER — Ambulatory Visit (INDEPENDENT_AMBULATORY_CARE_PROVIDER_SITE_OTHER): Payer: 59 | Admitting: Obstetrics & Gynecology

## 2015-02-15 ENCOUNTER — Encounter: Payer: Self-pay | Admitting: Obstetrics & Gynecology

## 2015-02-15 VITALS — BP 110/60 | HR 80 | Wt 174.4 lb

## 2015-02-15 DIAGNOSIS — B373 Candidiasis of vulva and vagina: Secondary | ICD-10-CM

## 2015-02-15 DIAGNOSIS — B3731 Acute candidiasis of vulva and vagina: Secondary | ICD-10-CM

## 2015-02-15 MED ORDER — TERCONAZOLE 0.4 % VA CREA: 1.0000 | TOPICAL_CREAM | Freq: Every day | VAGINAL | Status: DC

## 2015-02-15 NOTE — Progress Notes (Signed)
Patient ID: Karina Clayton, female   DOB: 04-Jan-1986, 29 y.o.   MRN: [id number] Chief Complaint  Patient presents with  . gyn visit    white discharge/ itching.    Blood pressure 110/60, pulse 80, weight 174 lb 6.4 oz (79.107 kg), last menstrual period 01/30/2015.  29 y.o. G1P1001 Patient's last menstrual period was 01/30/2015. The current method of family planning is OCP (estrogen/progesterone).  Subjective Pt with 3 days of itching, discharge, no odor, clumpy white No recent antibiotics  Objective Vulva:  normal appearing vulva with no masses, tenderness or lesions Vagina:  normal mucosa, curd-like discharge Cervix:  no lesions Uterus:  normal size, contour, position, consistency, mobility, non-tender Adnexa: ovaries:,      Pertinent ROS No burning with urination, frequency or urgency No nausea, vomiting or diarrhea Nor fever chills or other constitutional symptoms   Labs or studies     Impression Diagnoses this Encounter::   ICD-9-CM ICD-10-CM   1. Yeast vaginitis 112.1 B37.3     Established relevant diagnosis(es):   Plan/Recommendations: Meds ordered this encounter  Medications  . terconazole (TERAZOL 7) 0.4 % vaginal cream    Sig: Place 1 applicator vaginally at bedtime.    Dispense:  45 g    Refill:  0    Labs or Scans Ordered: No orders of the defined types were placed in this encounter.      Follow up Return if symptoms worsen or fail to improve, for with Dr Despina Hidden.        All questions were answered.

## 2015-08-10 ENCOUNTER — Other Ambulatory Visit: Payer: Self-pay | Admitting: Obstetrics & Gynecology

## 2015-08-11 ENCOUNTER — Emergency Department (HOSPITAL_COMMUNITY)
Admission: EM | Admit: 2015-08-11 | Discharge: 2015-08-11 | Disposition: A | Discharge status: Home / Self Care | Payer: 59 | Attending: Emergency Medicine | Admitting: Emergency Medicine

## 2015-08-11 ENCOUNTER — Emergency Department (HOSPITAL_COMMUNITY): Payer: 59

## 2015-08-11 ENCOUNTER — Encounter (HOSPITAL_COMMUNITY): Payer: Self-pay | Admitting: Emergency Medicine

## 2015-08-11 DIAGNOSIS — R51 Headache: Secondary | ICD-10-CM | POA: Insufficient documentation

## 2015-08-11 DIAGNOSIS — R110 Nausea: Secondary | ICD-10-CM | POA: Insufficient documentation

## 2015-08-11 DIAGNOSIS — R42 Dizziness and giddiness: Secondary | ICD-10-CM | POA: Insufficient documentation

## 2015-08-11 DIAGNOSIS — M542 Cervicalgia: Secondary | ICD-10-CM | POA: Insufficient documentation

## 2015-08-11 DIAGNOSIS — Z79899 Other long term (current) drug therapy: Secondary | ICD-10-CM | POA: Diagnosis not present

## 2015-08-11 DIAGNOSIS — R600 Localized edema: Secondary | ICD-10-CM | POA: Diagnosis present

## 2015-08-11 MED ORDER — AMOXICILLIN-POT CLAVULANATE 875-125 MG PO TABS: 1.0000 | ORAL_TABLET | Freq: Two times a day (BID) | ORAL | Status: DC

## 2015-08-11 MED ORDER — AMOXICILLIN-POT CLAVULANATE 200-28.5 MG/5ML PO SUSR: 200.0000 mg | Freq: Once | ORAL | Status: DC

## 2015-08-11 MED ORDER — AMOXICILLIN-POT CLAVULANATE 875-125 MG PO TABS
1.0000 | ORAL_TABLET | Freq: Once | ORAL | Status: AC
  Administered 2015-08-11: 1 via ORAL
  Filled 2015-08-11: qty 1

## 2015-08-11 MED ORDER — TETANUS-DIPHTH-ACELL PERTUSSIS 5-2.5-18.5 LF-MCG/0.5 IM SUSP
0.5000 mL | Freq: Once | INTRAMUSCULAR | Status: AC
  Administered 2015-08-11: 0.5 mL via INTRAMUSCULAR
  Filled 2015-08-11: qty 0.5

## 2015-08-11 MED ORDER — ACETAMINOPHEN 160 MG/5ML PO SOLN
650.0000 mg | Freq: Once | ORAL | Status: AC
  Administered 2015-08-11: 650 mg via ORAL
  Filled 2015-08-11: qty 20.3

## 2015-08-11 MED ORDER — TRAMADOL HCL 50 MG PO TABS: 50.0000 mg | ORAL_TABLET | Freq: Four times a day (QID) | ORAL | Status: DC | PRN

## 2015-08-11 NOTE — Discharge Instructions (Signed)
As discussed, your swelling should reduce over the next few days.  However, if you develop new, or concerning changes in her condition, do not hesitate to return here.  Otherwise, please take all medication as directed.   General Assault Assault includes any behavior or physical attack--whether it is on purpose or not--that results in injury to another person, damage to property, or both. This also includes assault that has not yet happened, but is planned to happen. Threats of assault may be physical, verbal, or written. They may be said or sent by:  Mail.  E-mail.  Text.  Social media.  Fax. The threats may be direct, implied, or understood. WHAT ARE THE DIFFERENT FORMS OF ASSAULT? Forms of assault include:  Physically assaulting a person. This includes physical threats to inflict physical harm as well as:  Slapping.  Hitting.  Poking.  Kicking.  Punching.  Pushing.  Sexually assaulting a person. Sexual assault is any sexual activity that a person is forced, threatened, or coerced to participate in. It may or may not involve physical contact with the person who is assaulting you. You are sexually assaulted if you are forced to have sexual contact of any kind.  Damaging or destroying a person's assistive equipment, such as glasses, canes, or walkers.  Throwing or hitting objects.  Using or displaying a weapon to harm or threaten someone.  Using or displaying an object that appears to be a weapon in a threatening manner.  Using greater physical size or strength to intimidate someone.  Making intimidating or threatening gestures.  Bullying.  Hazing.  Using language that is intimidating, threatening, hostile, or abusive.  Stalking.  Restraining someone with force. WHAT SHOULD I DO IF I EXPERIENCE ASSAULT?  Report assaults, threats, and stalking to the police. Call your local emergency services (911 in the [place] if you are in immediate danger or you need  medical help.  You can work with a Clinical research associatelawyer or an advocate to get legal protection against someone who has assaulted you or threatened you with assault. Protection includes restraining orders and private addresses. Crimes against you, such as assault, can also be prosecuted through the courts. Laws will vary depending on where you live.   This information is not intended to replace advice given to you by your health care provider. Make sure you discuss any questions you have with your health care provider.   Document Released: 05/26/2005 Document Revised: 06/16/2014 Document Reviewed: 02/10/2014 Elsevier Interactive Patient Education [employer]2016 [employer].
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
## 2015-08-11 NOTE — ED Notes (Signed)
All charting before 1924 was done by Jojo Pehl, RN 
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
## 2015-08-22 ENCOUNTER — Telehealth: Payer: Self-pay | Admitting: *Deleted

## 2015-08-22 ENCOUNTER — Telehealth: Payer: Self-pay | Admitting: Obstetrics & Gynecology

## 2015-08-22 MED ORDER — FLUCONAZOLE 150 MG PO TABS: 150.0000 mg | ORAL_TABLET | Freq: Once | ORAL | Status: DC

## 2015-08-31 NOTE — Telephone Encounter (Signed)
Done

## 2015-09-24 ENCOUNTER — Encounter (HOSPITAL_COMMUNITY): Payer: Self-pay | Admitting: *Deleted

## 2015-09-24 ENCOUNTER — Emergency Department (HOSPITAL_COMMUNITY)
Admission: EM | Admit: 2015-09-24 | Discharge: 2015-09-25 | Disposition: A | Discharge status: Home / Self Care | Payer: 59 | Attending: Emergency Medicine | Admitting: Emergency Medicine

## 2015-09-24 DIAGNOSIS — R197 Diarrhea, unspecified: Secondary | ICD-10-CM | POA: Diagnosis not present

## 2015-09-24 DIAGNOSIS — R112 Nausea with vomiting, unspecified: Secondary | ICD-10-CM

## 2015-09-24 LAB — BASIC METABOLIC PANEL
ANION GAP: 8 (ref 5–15)
BUN: 8 mg/dL (ref 6–20)
CO2: 26 mmol/L (ref 22–32)
Calcium: 8.8 mg/dL — ABNORMAL LOW (ref 8.9–10.3)
Chloride: 103 mmol/L (ref 101–111)
Creatinine, Ser: 0.96 mg/dL (ref 0.44–1.00)
GFR calc Af Amer: >60 mL/min (ref >60)
GLUCOSE: 117 mg/dL — AB (ref 65–99)
POTASSIUM: 4.1 mmol/L (ref 3.5–5.1)
SODIUM: 137 mmol/L (ref 135–145)

## 2015-09-24 LAB — URINALYSIS, ROUTINE W REFLEX MICROSCOPIC
Bilirubin Urine: NEGATIVE
GLUCOSE, UA: NEGATIVE mg/dL
HGB URINE DIPSTICK: NEGATIVE
KETONES UR: NEGATIVE mg/dL
LEUKOCYTES UA: NEGATIVE
Nitrite: NEGATIVE
PROTEIN: 30 mg/dL — AB
Specific Gravity, Urine: 1.02 (ref 1.005–1.030)
pH: 6.5 (ref 5.0–8.0)

## 2015-09-24 LAB — CBC
HCT: 41.4 % (ref 36.0–46.0)
Hemoglobin: 13.7 g/dL (ref 12.0–15.0)
MCH: 30.2 pg (ref 26.0–34.0)
MCHC: 33.1 g/dL (ref 30.0–36.0)
MCV: 91.2 fL (ref 78.0–100.0)
PLATELETS: 226 10*3/uL (ref 150–400)
RBC: 4.54 MIL/uL (ref 3.87–5.11)
RDW: 12.8 % (ref 11.5–15.5)
WBC: 5 10*3/uL (ref 4.0–10.5)

## 2015-09-24 LAB — URINE MICROSCOPIC-ADD ON

## 2015-09-24 LAB — PREGNANCY, URINE: PREG TEST UR: NEGATIVE

## 2015-09-24 MED ORDER — ONDANSETRON HCL 4 MG PO TABS: 4.0000 mg | ORAL_TABLET | Freq: Four times a day (QID) | ORAL | Status: DC

## 2015-09-24 MED ORDER — ONDANSETRON HCL 4 MG/2ML IJ SOLN
4.0000 mg | Freq: Once | INTRAMUSCULAR | Status: AC
  Administered 2015-09-24: 4 mg via INTRAVENOUS
  Filled 2015-09-24: qty 2

## 2015-09-24 MED ORDER — ONDANSETRON 4 MG PO TBDP
4.0000 mg | ORAL_TABLET | Freq: Once | ORAL | Status: AC
Start: 2015-09-24 — End: 2015-09-24
  Administered 2015-09-24: 4 mg via ORAL
  Filled 2015-09-24: qty 1

## 2015-09-24 MED ORDER — SODIUM CHLORIDE 0.9 % IV BOLUS (SEPSIS)
1000.0000 mL | Freq: Once | INTRAVENOUS | Status: AC
  Administered 2015-09-24: 1000 mL via INTRAVENOUS
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
## 2015-09-24 NOTE — ED Notes (Signed)
Patient tolerated PO fluids without NV

## 2015-09-24 NOTE — ED Notes (Signed)
Pt is here for n/v/d with body aches.  Her son had same symptoms Friday pm.  Pt states that she has not been able to keep anything down.

## 2015-09-24 NOTE — Discharge Instructions (Signed)

## 2015-11-06 ENCOUNTER — Other Ambulatory Visit: Payer: 59 | Admitting: Obstetrics & Gynecology

## 2015-11-15 ENCOUNTER — Ambulatory Visit (INDEPENDENT_AMBULATORY_CARE_PROVIDER_SITE_OTHER): Payer: 59 | Admitting: Obstetrics & Gynecology

## 2015-11-15 ENCOUNTER — Encounter: Payer: Self-pay | Admitting: Obstetrics & Gynecology

## 2015-11-15 ENCOUNTER — Other Ambulatory Visit (HOSPITAL_COMMUNITY)
Admission: RE | Admit: 2015-11-15 | Discharge: 2015-11-15 | Disposition: A | Discharge status: Home / Self Care | Payer: 59 | Source: Ambulatory Visit | Attending: Obstetrics & Gynecology | Admitting: Obstetrics & Gynecology

## 2015-11-15 VITALS — BP 124/86 | HR 88 | Ht 68.0 in | Wt 192.0 lb

## 2015-11-15 DIAGNOSIS — Z1151 Encounter for screening for human papillomavirus (HPV): Secondary | ICD-10-CM | POA: Insufficient documentation

## 2015-11-15 DIAGNOSIS — Z113 Encounter for screening for infections with a predominantly sexual mode of transmission: Secondary | ICD-10-CM | POA: Insufficient documentation

## 2015-11-15 DIAGNOSIS — Z01411 Encounter for gynecological examination (general) (routine) with abnormal findings: Secondary | ICD-10-CM | POA: Diagnosis present

## 2015-11-15 DIAGNOSIS — Z01419 Encounter for gynecological examination (general) (routine) without abnormal findings: Secondary | ICD-10-CM | POA: Diagnosis not present

## 2015-11-15 MED ORDER — DESOGESTREL-ETHINYL ESTRADIOL 0.15-30 MG-MCG PO TABS: ORAL_TABLET | ORAL | Status: DC

## 2015-11-15 NOTE — Addendum Note (Signed)
Addended by: Richardson ChiquitoRAVIS, Briunna M on: 11/15/2015 09:42 AM   Modules accepted: Orders
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
## 2015-11-16 LAB — CYTOLOGY - PAP

## 2015-12-04 ENCOUNTER — Inpatient Hospital Stay
Admission: EM | Admit: 2015-12-04 | Discharge: 2015-12-05 | Disposition: A | Discharge status: Home / Self Care | Payer: PRIVATE HEALTH INSURANCE | Source: Other Acute Inpatient Hospital

## 2015-12-04 DIAGNOSIS — L03115 Cellulitis of right lower limb: Secondary | ICD-10-CM

## 2015-12-04 LAB — BLOOD CULTURE-PERIPHERAL
Culture Result: NO GROWTH
Culture Result: NO GROWTH

## 2015-12-04 LAB — BASIC METABOLIC PANEL
Anion Gap: 9 mmol/L (ref 3–16)
BUN: 10 mg/dL (ref 7–25)
CO2: 27 mmol/L (ref 21–33)
Calcium: 9.4 mg/dL (ref 8.6–10.3)
Chloride: 103 mmol/L (ref 98–110)
Creatinine: 0.63 mg/dL (ref 0.60–1.30)
Glucose: 108 mg/dL — ABNORMAL HIGH (ref 70–100)
Osmolality, Calculated: 288 mosm/kg (ref 278–305)
Potassium: 3.7 mmol/L (ref 3.5–5.3)
Sodium: 139 mmol/L (ref 133–146)
eGFR AA CKD-EPI: >90 See note.
eGFR NONAA CKD-EPI: >90 See note.

## 2015-12-04 LAB — CBC
Hematocrit: 35.7 % (ref 35.0–45.0)
Hemoglobin: 11.5 g/dL (ref 11.7–15.5)
MCH: 27.3 pg (ref 27.0–33.0)
MCHC: 32.3 g/dL (ref 32.0–36.0)
MCV: 84.4 fL (ref 80.0–100.0)
MPV: 9.1 fL (ref 7.5–11.5)
Platelets: 258 10*3/uL (ref 140–400)
RBC: 4.23 10*6/uL (ref 3.80–5.10)
RDW: 14.3 % (ref 11.0–15.0)
WBC: 13.6 10*3/uL (ref 3.8–10.8)

## 2015-12-04 LAB — DIFFERENTIAL
Basophils Absolute: 68 /uL (ref 0–200)
Basophils Relative: 0.5 % (ref 0.0–1.0)
Eosinophils Absolute: 150 /uL (ref 15–500)
Eosinophils Relative: 1.1 % (ref 0.0–8.0)
Lymphocytes Absolute: 2366 /uL (ref 850–3900)
Lymphocytes Relative: 17.4 % (ref 15.0–45.0)
Monocytes Absolute: 1346 /uL — ABNORMAL HIGH (ref 200–950)
Monocytes Relative: 9.9 % (ref 0.0–12.0)
Neutrophils Absolute: 9670 /uL — ABNORMAL HIGH (ref 1500–7800)
Neutrophils Relative: 71.1 % (ref 40.0–80.0)
nRBC: 0 /100{WBCs} (ref 0–0)

## 2015-12-04 MED ORDER — ondansetron (ZOFRAN) tablet 4 mg
4 | Freq: Four times a day (QID) | ORAL | Status: AC | PRN
Start: 2015-12-04 — End: 2015-12-05

## 2015-12-04 MED ORDER — ondansetron (ZOFRAN) 4 mg/2 mL injection 4 mg
4 | Freq: Four times a day (QID) | INTRAMUSCULAR | Status: AC | PRN
Start: 2015-12-04 — End: 2015-12-05

## 2015-12-04 MED ORDER — ibuprofen (ADVIL,MOTRIN) tablet 800 mg
400 | Freq: Three times a day (TID) | ORAL | Status: AC | PRN
Start: 2015-12-04 — End: 2015-12-05
  Administered 2015-12-04 – 2015-12-05 (×2): 800 mg via ORAL

## 2015-12-04 MED ORDER — ceFAZolin (ANCEF) in D5W 50 mL 2 gram/50 mL
2 | INTRAVENOUS | Status: AC
Start: 2015-12-04 — End: 2015-12-04
  Administered 2015-12-04: 21:00:00 2 via INTRAVENOUS

## 2015-12-04 MED ORDER — acetaminophen (TYLENOL) suppository 650 mg
650 | RECTAL | Status: AC | PRN
Start: 2015-12-04 — End: 2015-12-05

## 2015-12-04 MED ORDER — methadone (DOLOPHINE) tablet 50 mg
10 | Freq: Every day | ORAL | Status: AC
Start: 2015-12-04 — End: 2015-12-05
  Administered 2015-12-05: 13:00:00 50 mg via ORAL

## 2015-12-04 MED ORDER — ceFAZolin (ANCEF) IVPB 2 g in D5W (duplex)
2 | Freq: Three times a day (TID) | INTRAVENOUS | Status: AC
Start: 2015-12-04 — End: 2015-12-05
  Administered 2015-12-05 (×2): 2 g via INTRAVENOUS

## 2015-12-04 MED ORDER — doxycycline (MONODOX) 100 MG capsule
100 | ORAL | Status: AC
Start: 2015-12-04 — End: 2015-12-05

## 2015-12-04 MED ORDER — proMETHazine (PHENERGAN) tablet 25 mg
25 | Freq: Four times a day (QID) | ORAL | Status: AC | PRN
Start: 2015-12-04 — End: 2015-12-05

## 2015-12-04 MED ORDER — proMETHazine (PHENERGAN) injection 12.5 mg
25 | Freq: Four times a day (QID) | INTRAMUSCULAR | Status: AC | PRN
Start: 2015-12-04 — End: 2015-12-05

## 2015-12-04 MED ORDER — acetaminophen (TYLENOL) tablet 650 mg
325 | ORAL | Status: AC | PRN
Start: 2015-12-04 — End: 2015-12-05

## 2015-12-04 MED ORDER — ceFAZolin (ANCEF) IVPB 2 g in D5W (duplex)
2 | Freq: Three times a day (TID) | INTRAVENOUS | Status: AC
Start: 2015-12-04 — End: 2015-12-04

## 2015-12-04 MED ORDER — traMADol (ULTRAM) tablet 50 mg
50 | Freq: Four times a day (QID) | ORAL | Status: AC | PRN
Start: 2015-12-04 — End: 2015-12-05
  Administered 2015-12-05: 06:00:00 50 mg via ORAL

## 2015-12-04 MED ORDER — doxycycline (MONODOX) capsule 100 mg
100 | Freq: Two times a day (BID) | ORAL | Status: AC
Start: 2015-12-04 — End: 2015-12-05
  Administered 2015-12-04 – 2015-12-05 (×2): 100 mg via ORAL

## 2015-12-04 MED ORDER — doxycycline (MONODOX) capsule 100 mg
100 | Freq: Two times a day (BID) | ORAL | Status: AC
Start: 2015-12-04 — End: 2015-12-04

## 2015-12-04 MED FILL — DOXYCYCLINE MONOHYDRATE 100 MG CAPSULE: 100 100 MG | ORAL | Qty: 1

## 2015-12-04 MED FILL — CEFAZOLIN 2 GRAM/50 ML IN DEXTROSE (ISO-OSMOTIC) INTRAVENOUS PIGGYBACK: 2 2 gram/50 mL | INTRAVENOUS | Qty: 50

## 2015-12-04 MED FILL — IBUPROFEN 400 MG TABLET: 400 400 MG | ORAL | Qty: 2

## 2015-12-04 NOTE — Unmapped (Signed)
Bed: CD15U  Expected date: 12/04/15  Expected time: 5:24 PM  Means of arrival:   Comments:  Hold for I07

## 2015-12-04 NOTE — Unmapped (Signed)
Center for Emergency Care Clinical Decision Unit  [facility]  Admission Note/Summary    Date of placement in ED Observation:  Last order of PLACE IN ED OBSERVATION was found on 12/04/2015 from Hospital Encounter on 12/04/2015      Patient History      Autumn Shepard is a 30 y.o. female who presented to the Emergency Department with 2-day concern for redness, pain and swelling to her right thigh. The patient reports prior evaluation at the emergency department of an outside hospital yesterday, where an incision and drainage procedure was performed with drainage of a small amount of fluid. The patient was prescribed a 7-day course of Keflex and Bactrim, but the patient was only taking 1 tablet of Bactrim instead of the prescribed 2 tablets.  The acute evaluation for cellulitis included bedside ultrasound which found no additional fluid collection.  CBC with differential, renal panel and 2 x blood cultures were ordered but have not yet been obtained. The patient was started on Ancef and doxycycline for antibiotic coverage. Given that the patient was not taking the correct dosage of Bactrim, this cannot be considered a failure of outpatient treatment, and as such, is an appropriate admission to the CDU.    Upon admission to the Clinical Decision Unit, Autumn Shepard continued to complain of a throbbing pain in her right thigh. She also endorsed worsening of the erythema from her presentation at the outside hospital yesterday. She also endorses recent history of subjective fevers and nausea with vomiting which she attributes to a new antihypertensive medication that she was prescribed but has since discontinued.      Past Medical History   has a past medical history of Asthma.    Past Surgical History   has no past surgical history on file.    Social History  Autumn Shepard  reports that she has been smoking Cigarettes.  She has been smoking about 0.50 packs per day. She does not have  any smokeless tobacco history on file. She reports that she does not drink alcohol. Her drug history is not on file.      Family History  family history is not on file.     Medications      Previous Medications    METHADONE (DOLOPHINE) 10 MG TABLET    Take 50 mg by mouth every 8 hours as needed for Pain.    SULFAMETHOXAZOLE/TRIMETHOPRIM (BACTRIM ORAL)    Take by mouth.          Review of Systems      Review of Systems   Constitutional: Positive for fever. Negative for chills, diaphoresis and fatigue.   HENT: Negative for mouth sores and sore throat.    Eyes: Negative for pain and visual disturbance.   Respiratory: Negative for cough and shortness of breath.    Cardiovascular: Negative for chest pain.   Gastrointestinal: Positive for nausea and vomiting. Negative for abdominal pain and diarrhea.   Musculoskeletal: Negative for myalgias, back pain, arthralgias, gait problem and neck pain.   Skin: Positive for color change. Negative for pallor and rash.   Neurological: Negative for dizziness and headaches.   Psychiatric/Behavioral: The patient is not nervous/anxious.         Physical examination      Vital signs:   Filed Vitals:    12/04/15 1558   BP: 129/80   Pulse: 96   Temp: 98.1 ??F (36.7 ??C)   Resp: 18   SpO2:  99%       Physical Exam   Nursing note and vitals reviewed.  Constitutional: She appears well-developed and well-nourished. No distress.   HENT:   Head: Normocephalic and atraumatic.   Right Ear: External ear normal.   Left Ear: External ear normal.   Nose: Nose normal.   Mouth/Throat: Oropharynx is clear and moist.   Eyes: EOM are normal. Right eye exhibits no discharge. Left eye exhibits no discharge.   Neck: Normal range of motion. No tracheal deviation present.   Cardiovascular: Normal rate, regular rhythm, normal heart sounds and intact distal pulses.    Pulmonary/Chest: Effort normal and breath sounds normal. No stridor. No respiratory distress. She exhibits no tenderness.   Abdominal: Soft. She  exhibits no distension. There is no tenderness. There is no guarding.   Musculoskeletal: Normal range of motion. She exhibits no edema or tenderness.   Neurological: Normal speech without aphasia or dysarthria.  Moves all extremities spontaneosly and symmetrically.    Skin: Skin is warm and dry. No rash noted. She is not diaphoretic. There is erythema. No pallor.   1 cm incision to the right medial thigh with surrounding erythema and induration; packing tape in place; erythematous borders marked and dated; overlying skin is warm to touch; +2 dorsalis pedis pulses bilaterally; sensation intact and equal in bilateral lower extremities; full active range of motion of bilateral hips, knees, and ankles.   Psychiatric: She has a normal mood and affect. Judgment normal.              Diagnostic evaluation      Diagnostic studies and ED interventions germane to this period of clinical observation will include:    - IV and oral antibiotics  - Symptomatic observation  - Pain control           Consultant(s)      none     Impression and Plan      In summary, Autumn Shepard is admitted by to the [facility] for Emergency Care Clinical Decision Unit for cellulitis.  Dr. Loleta Chance is the CDU admission Attending.      This patient has been risk-stratified based on the available history, physical exam, and related study findings.  Admission to observation status for further diagnosis/treatment/monitoring of cellulitis is warranted clinically.  This extended period of observation is specifically required to determine the need for hospitalization.      The goals of this admission based on this patient's clinical problem list are:   1) Administration of IV antibiotics   2) Pain management   3) Abscess and cellulitis evaluation, including measurement of erythema, swelling and tenderness.     We will observe the patient for the following endpoints:   1) Recession of the erythema and swelling from the marked borders   2) Improved clinical  signs of infection   3) Improvement in the patient's overall clinical picture     When met, appropriate disposition will be arranged.

## 2015-12-04 NOTE — Unmapped (Signed)
ED Attending ED Observation Admission Attestation Note    Date of service:  12/04/2015    This patient was seen by the advanced practice provider.  I have seen and examined the patient, agree with the workup, evaluation, management and diagnosis. The care plan has been discussed and I concur.       My assessment reveals a 30 y.o. female who presents to the Emergency Department with complaints of left leg pain and found to have an extensive area of cellulitis in the right medial leg.  The patient was not systemically ill.  She will be admitted to the observation protocol for care and management of her cellulitis.

## 2015-12-04 NOTE — Unmapped (Signed)
[place] ED Note    Date of service:  12/04/2015    Reason for Visit: Wound Check      Patient History     HPI:  Autumn Shepard is a 30 y.o. female with PMHx of asthma  presents with chief complaint of Wound Check    Patient presents for wound check. Per the patient, last 2 days she has had gradual increasing in redness and pain over her right medial leg.  His been over the last 2 days.  She was in [place] where they attempted an I and D but did not find any purulent drainage.  She has been on Keflex as well as Bactrim double strength for the whole last day.  No swelling of the lower cavities.  No chest pain or shortness of breath.  No fever, nausea, vomiting    Aside from the above, patient denies any aggravating or alleviating factors or associated symptoms.          Past Medical History   Diagnosis Date   ??? Asthma        History reviewed. No pertinent past surgical history.    Autumn Shepard  reports that she has been smoking Cigarettes.  She has been smoking about 0.50 packs per day. She does not have any smokeless tobacco history on file. She reports that she does not drink alcohol. Her drug history is not on file.    Previous Medications    METHADONE (DOLOPHINE) 10 MG TABLET    Take 50 mg by mouth every 8 hours as needed for Pain.    SULFAMETHOXAZOLE/TRIMETHOPRIM (BACTRIM ORAL)    Take by mouth.       Allergies:   Allergies as of 12/04/2015   ??? (No Known Allergies)       Review of Systems     ROS:  As in HPI. All systems reviewed and otherwise negative. Specifically, positive forLegs redness but no fever, nausea, vomiting, abdominal pain, chest pain, shortness breath, weakness, numbness, headache  ROS    Physical Exam     ED Triage Vitals   Vital Signs Group      Temp 12/04/15 1558 98.1 ??F (36.7 ??C)      Temp Source 12/04/15 1558 Oral      Heart Rate 12/04/15 1558 96      Heart Rate Source 12/04/15 1558 Automatic      Resp 12/04/15 1558 18      SpO2  12/04/15 1558 99 %      BP 12/04/15 1558 129/80 mmHg      BP Location 12/04/15 1558 Left arm      BP Method 12/04/15 1558 Automatic      Patient Position 12/04/15 1558 Sitting   SpO2 12/04/15 1558 99 %   O2 Device 12/04/15 1558 None (Room air)       General:Well-appearing Caucasian female    HEENT:  Normocephalic, atraumatic. Mucous membranes are moist.     Neck:  Supple, trachea midline      Pulmonary:   Lungs clear to auscultation bilaterally. No increased work of breathing.    Cardiac:  Regular rate and rhythm. Normal S1 and S2. No murmurs/rubs/gallops.    Abdomen:  Soft, non-tender, non-distended. NABS     Extremities: 2+ radial pulses, no deformity, swelling, tenderness     Skin:  No rashes or bruising.  Erythema of the right medial leg that extends up the medial thigh.  On ultrasound there is no visualized pocket  of anechoic material.  This is increased from yesterday.  No fluctuance.    Neuro:  Alert and oriented x3. Speech normal. Moves UE and LE spontaneously.     Psych:  Mood and affect appropriate for situation.       Diagnostic Studies     Labs:    Please see electronic medical record for any tests performed in the ED     Radiology:    Please see electronic medical record for any tests performed in the ED        Emergency Department Procedures     Procedures      ED Course and MDM     Autumn Shepard is a 30 y.o. female with a history and presentation as described above in HPI.  The patient was evaluated by myself and the ED Attending Physician, Dr.Hill. All management and disposition plans were discussed and agreed upon. The patient was roomed and an IV was placed. Appropriate labs and diagnostic studies were reviewed as they were made available. Pertinent laboratory studies in medical decision making are listed below.     Upon presentation, the patient was afebrile and hemodynamically stable.    Patient was presenting for cellulitis.  However, is incompletely treated as an outpatient with Bactrim twice  a day but not double strength twice a day.  She was taking Keflex as well, compliant with therapy.  However, there was obvious extension of cellulitis on exam.  She had no signs of compartment syndrome, no neck fasc.  She was neurovascular intact in distal extremity.  Limited bedside ultrasound was used to evaluate for anechoic pocket which was not revealed.  At this time, we consider this a treatment failure.  However, she had been on incomplete dose of Bactrim.  She'll be trialed on ED observation with Ancef every 8 as well as doxycycline by mouth.  She'll be put on the cellulitis protocol.  CBC, renal, blood cultures drawn.  She will be monitored emergency department transfer to retrieve location.          Consults:      None     Summary of Treatment in ED:    Medications   ceFAZolin (ANCEF) IVPB 2 g in D5W (duplex) (not administered)   doxycycline (MONODOX) capsule 100 mg (not administered)           Impression     1. Cellulitis of right lower extremity             [name], MD  Resident  12/04/15 [phone]

## 2015-12-04 NOTE — Unmapped (Signed)
Patient is alert and oriented x4, has redness to right inner thigh that is hot and swollen. Area was I&D yesterday at [facility] and placed packing, same packing is still in place. Redness is marked. Right leg elevated in pillows. Patient oriented to room. Given menu and ice water. Denies any other needs at this time. Call light in reach. Will continue to monitor.

## 2015-12-04 NOTE — Unmapped (Signed)
ED Attending Attestation Note    Date of service:  12/04/2015    This patient was seen by the resident physician.  I have seen and examined the patient, agree with the workup, evaluation, management and diagnosis. The care plan has been discussed and I concur.     My assessment reveals a 30 y.o. female who presents to the Emergency Department with complaints of right leg pain.  On examination the patient has an extensive area of cellulitis in the right medial thigh

## 2015-12-04 NOTE — Unmapped (Signed)
Pt was seen at [facility] yesterday and told she had some sort of insect bite.  She states they lanced it there but there was not much drainage.  Pt was discharged with antibiotics and told to follow up.  Pt states she feels as if it is getting worse and wants another doctor's opinion.

## 2015-12-04 NOTE — Unmapped (Signed)
Center for Emergency Care Clinical Decision Unit  [facility]  Progress Note       Subjective      LEEANDRA ELLERSON has been admitted to the CDU for 6 hours.  Serial assessments of her clinical progress include:    - Patient is resting comfortably with no complaints at this time            Physical examination     Vital signs:   Filed Vitals:    12/04/15 2243   BP: 122/62   Pulse: 67   Temp: 98.1 ??F (36.7 ??C)   Resp: 16   SpO2: 98%                General: Well-developed, well-nourished in no acute distress    Musculoskeletal: Distal pulses 2+ bilaterally of upper and lower extremities.  On examination she has a large area of erythema and warmth that extends along the right medial leg and thigh with induration at the more distal aspect just above the knee.  This is an area that has been incised and drained with packing in place at the distal aspect.  The area of erythema has slightly progressed outside of the outlined margins    Neurologic: alert and oriented x4    Psychiatric: mood, affect and behavior are appropriate     Diagnostic evaluation      1) Additional diagnostic data is pending at the time of this progress note.       Assessment      KAIDYN HERNANDES continues to be managed in accordance with the CDU clinical guidelines for cellulitis.  An update of her clinical problem list includes:    1) vitals signs are stable  2) the patient denies any complaints at this time except some mild discomfort around the cellulitic site  3) the area of erythema has progressed slightly outside the outlined margins and new margin lines have been drawn       Plan      1)Continue to monitor vital signs  2) continue antibiotics  3) reassess the area of cellulitis  4) continue symptomatic treatment

## 2015-12-05 MED ORDER — nicotine (polacrilex) (NICORETTE/NICORELIEF) gum 2 mg
2 | BUCCAL | Status: AC | PRN
Start: 2015-12-05 — End: 2015-12-05

## 2015-12-05 MED ORDER — doxycycline (VIBRAMYCIN) 100 MG capsule
100 | ORAL_CAPSULE | Freq: Two times a day (BID) | ORAL | Status: AC
Start: 2015-12-05 — End: 2015-12-12

## 2015-12-05 MED ORDER — nicotine (NICODERM CQ) 21 mg/24 hr 1 patch
21 | Freq: Once | TRANSDERMAL | Status: AC
Start: 2015-12-05 — End: 2015-12-05
  Administered 2015-12-05: 14:00:00 1 via TRANSDERMAL

## 2015-12-05 MED ORDER — nicotine (NICODERM CQ) 21 mg/24 hr
21 | MEDICATED_PATCH | TRANSDERMAL | Status: AC
Start: 2015-12-05 — End: ?

## 2015-12-05 MED ORDER — cephalexin 500 mg tablet
500 | ORAL_TABLET | Freq: Four times a day (QID) | ORAL | Status: AC
Start: 2015-12-05 — End: 2015-12-12

## 2015-12-05 MED FILL — NICORELIEF 2 MG GUM: 2 2 mg | BUCCAL | Qty: 1

## 2015-12-05 MED FILL — DOXYCYCLINE MONOHYDRATE 100 MG CAPSULE: 100 100 MG | ORAL | Qty: 1

## 2015-12-05 MED FILL — METHADONE 10 MG TABLET: 10 10 MG | ORAL | Qty: 5

## 2015-12-05 MED FILL — CEFAZOLIN 2 GRAM/50 ML IN DEXTROSE (ISO-OSMOTIC) INTRAVENOUS PIGGYBACK: 2 2 gram/50 mL | INTRAVENOUS | Qty: 50

## 2015-12-05 MED FILL — NICOTINE 21 MG/24 HR DAILY TRANSDERMAL PATCH: 21 21 mg/24 hr | TRANSDERMAL | Qty: 1

## 2015-12-05 MED FILL — TRAMADOL 50 MG TABLET: 50 50 mg | ORAL | Qty: 1

## 2015-12-05 MED FILL — IBUPROFEN 400 MG TABLET: 400 400 MG | ORAL | Qty: 2

## 2015-12-05 NOTE — Unmapped (Signed)
Center for Emergency Care Clinical Decision Unit  [facility]  Progress Note       Subjective      Autumn Shepard has been admitted to the CDU for 18 hours.  Serial assessments of her clinical progress include:    - Hemodynamically stable  - Pain improving            Physical examination     Vital signs:   Filed Vitals:    12/04/15 2243 12/05/15 0230 12/05/15 0559 12/05/15 0746   BP: 122/62 119/70 117/59 109/65   Pulse: 67 62 81 75   Temp: 98.1 ??F (36.7 ??C) 98.2 ??F (36.8 ??C) 98.4 ??F (36.9 ??C) 98 ??F (36.7 ??C)   TempSrc: Oral Oral Oral Oral   Resp: 16 16 16 18    Height:       Weight:       SpO2: 98% 98% 98% 97%                Physical Exam   Vitals reviewed.  Constitutional: She is oriented to person, place, and time. She appears well-developed and well-nourished. No distress.   HENT:   Head: Normocephalic and atraumatic.   Eyes: EOM are normal. Pupils are equal, round, and reactive to light.   Neck: Normal range of motion. Neck supple.   Cardiovascular: Normal rate, regular rhythm, normal heart sounds and intact distal pulses.    No murmur heard.  Pulmonary/Chest: Effort normal and breath sounds normal. No respiratory distress. She exhibits no tenderness.   Abdominal: Soft. Bowel sounds are normal.   Musculoskeletal: Normal range of motion.   Neurological: She is alert and oriented to person, place, and time. No cranial nerve deficit.   Skin: Skin is warm and dry. She is not diaphoretic. There is erythema.   There is significant erythema to the medial aspect of the right lower extremity thigh, although the erythema has receded significantly from the superior border, the area is minimally tender to palpation with no induration or fluctuance palpated, the area is soft, there is no streaking to surrounding areas, there is no drainage   Psychiatric: She has a normal mood and affect. Her behavior is normal. Thought content normal.        Diagnostic evaluation      No orders to display     Labs  Reviewed   CBC - Abnormal; Notable for the following:     WBC 13.6 (*)     Hemoglobin 11.5 (*)     All other components within normal limits   DIFFERENTIAL - Abnormal; Notable for the following:     Neutrophils Absolute 9670 (*)     Monocytes Absolute 1346 (*)     All other components within normal limits   BASIC METABOLIC PANEL - Abnormal; Notable for the following:     Glucose 108 (*)     All other components within normal limits    Narrative:     As of 08/08/2014 the estimated GFR is calculated from serum creatinine using the Chronic Kidney Disease  Epidemiology Collaboration (CKD-EPI) equation in patients 18 years and older.  The reference range is   >60 mL/min/1.77m2.  eGFR values greater than 90 will be reported as >94mL/min/1.73m2.  Reference: Cherie Dark AS, Suella [place] [facility], Stefano Gaul AF, 3rd, Feldman HI, et. al.  A new equation to estimate glomerular filtration rate.  Ann Intern Med. 2009:150(9):604-12        Assessment  Autumn Shepard continues to be managed in accordance with the CDU clinical guidelines for cellulitis.  An update of her clinical problem list includes:    1) cellulitis         Plan      1) Continue Hemodynamic monitoring  2) Symptom management  3) Antibiotics  4) obtain PCP follow up

## 2015-12-05 NOTE — Unmapped (Signed)
Center for Emergency Care Clinical Decision Unit  [facility]  Disposition Note / Summary     Date of placement in ED Observation:  Last order of PLACE IN ED OBSERVATION was found on 12/04/2015 from Hospital Encounter on 12/04/2015      Subjective      Autumn Shepard has undergone comprehensive diagnostic evaluation and therapeutic management in accordance with the CDU guidelines for cellulitis. Based on her clinical response and diagnostic information obtained during this period of observation, it has been determined that she will be discharged home.       Physical examination      Vital signs:   Filed Vitals:    12/05/15 1206   BP: 106/55   Pulse: 64   Temp: 97.9 ??F (36.6 ??C)   Resp: 20   SpO2: 98%         ED Physical Exam   Vitals reviewed.  Constitutional: She is oriented to person, place, and time. She appears well-developed and well-nourished. No distress.   HENT: ??  Head: Normocephalic and atraumatic.   Eyes: EOM are normal. Pupils are equal, round, and reactive to light.   Neck: Normal range of motion. Neck supple.   Cardiovascular: Normal rate, regular rhythm, normal heart sounds and intact distal pulses.?? ??  No murmur heard.  Pulmonary/Chest: Effort normal and breath sounds normal. No respiratory distress. She exhibits no tenderness.   Abdominal: Soft. Bowel sounds are normal.   Musculoskeletal: Normal range of motion.   Neurological: She is alert and oriented to person, place, and time. No cranial nerve deficit.   Skin: Skin is warm and dry. She is not diaphoretic. There is erythema.   There is  erythema to the medial aspect of the right lower extremity thigh, although the erythema has receded significantly from the superior border, is about half the size that it was on admission to the observation unit, the area is minimally tender to palpation with no induration or fluctuance palpated, the area is soft, there is no streaking to surrounding areas, there is no drainage    Psychiatric: She has a normal mood and affect. Her behavior is normal. Thought content normal.      Diagnostic evaluation     Diagnostic studies germane to this period of clinical observation include:    Labs Reviewed   CBC - Abnormal; Notable for the following:     WBC 13.6 (*)     Hemoglobin 11.5 (*)     All other components within normal limits   DIFFERENTIAL - Abnormal; Notable for the following:     Neutrophils Absolute 9670 (*)     Monocytes Absolute 1346 (*)     All other components within normal limits   BASIC METABOLIC PANEL - Abnormal; Notable for the following:     Glucose 108 (*)     All other components within normal limits    Narrative:     As of 08/08/2014 the estimated GFR is calculated from serum creatinine using the Chronic Kidney Disease  Epidemiology Collaboration (CKD-EPI) equation in patients 18 years and older.  The reference range is   >60 mL/min/1.69m2.  eGFR values greater than 90 will be reported as >45mL/min/1.73m2.  Reference: Cherie Dark AS, [facility] [place] [facility], Stefano Gaul AF, 3rd, Feldman HI, et. al.  A new equation to estimate glomerular filtration rate.  Ann Intern Med. 2009:150(9):604-12     Medications   ceFAZolin (ANCEF) IVPB 2 g in D5W (duplex) (  2 g Intravenous New Bag 12/05/15 1257)   doxycycline (MONODOX) capsule 100 mg (100 mg Oral Given 12/05/15 0833)   doxycycline (MONODOX) 100 MG capsule (  Not Given 12/04/15 1716)   methadone (DOLOPHINE) tablet 50 mg (50 mg Oral Given 12/05/15 0833)   acetaminophen (TYLENOL) tablet 650 mg (not administered)     Or   acetaminophen (TYLENOL) suppository 650 mg (not administered)   ibuprofen (ADVIL,MOTRIN) tablet 800 mg (800 mg Oral Given 12/05/15 0840)   ondansetron (ZOFRAN) tablet 4 mg (not administered)     Or   ondansetron (ZOFRAN) 4 mg/2 mL injection 4 mg (not administered)   proMETHazine (PHENERGAN) tablet 25 mg (not administered)     Or   proMETHazine (PHENERGAN) injection 12.5 mg (not administered)   traMADol (ULTRAM) tablet 50 mg (50  mg Oral Given 12/05/15 0615)   nicotine (NICODERM CQ) 21 mg/24 hr 1 patch (1 patch Transdermal Patch Applied 12/05/15 1001)   nicotine (polacrilex) (NICORETTE/NICORELIEF) gum 2 mg (not administered)   ceFAZolin (ANCEF) in D5W 50 mL 2 gram/50 mL (  Stopped 12/04/15 1734)          Consultant(s) final recommendations      1) n/a     Impression and Plan      Autumn Shepard has been cared for according to the standard [facility] for Emergency Care Clinical Decision Unit observation protocol for cellulitis. This extended period of observation was specifically required to determine the need for hospitalization. Prior to discharge from observation, the final physical exam is documented above.      The patient was admitted to the CDU for a right lower extremity cellulitis which has shown significant improvement from the original borders, it has receded to about half of the original size.  She will be sent home on Keflex and doxy antibiotics.  She is given very strict return precautions, to return to the ED for any new or worsening symptoms.  Otherwise, she will follow up with her primary care physician.  The patient is verbalizing understanding and in agreement with the plan of care.    Significant events during the course of observation based on the goals of the clinical problem list include:    1) hemodynamically stable, afebrile  2) cellulitis responding to antibiotics    Based on the patient's condition and test results, the patient will be Discharge home.  See AVS for patient instructions..    The total length of observation was 21.5 hours. Dr. Richardo Priest is the CDU disposition attending.    The patient will follow-up with:    1) PCP  2) In the ED for new/worsening symptoms    As appropriate, please see the AVS for comprehensive discharge instructions.

## 2015-12-05 NOTE — Unmapped (Signed)
This RN assuming care for this pt. Pt sitting up in bed eating. Pt A&Ox4. Respirations easy, even, unlabored. Skin warm, dry. Pt c/o right upper leg pain at a 4/10. Pt right thigh red, hot, edematous, redness within margins. Pt denies any needs at this time. Call light within reach.

## 2015-12-05 NOTE — Unmapped (Signed)
Pt resting in bed with eyes closed, CLWR, bed locked and low, will continue to monitor

## 2015-12-05 NOTE — Unmapped (Signed)
[facility]    Care Coordinator Discharge Summary     Patient name: Autumn Shepard                                        Patient MRN: [id number]  DOB: Feb 10, 1986                              Age: 30 y.o.              Gender: female  Patient emergency contact: Extended Emergency Contact Information  Primary Emergency Contact: Gavidia,[place]   [place]  Mobile Phone: [phone]  Relation: Father      Attending provider: No att. providers found  Primary care physician: Kasandra Knudsen, MD [phone]  The MD has indicated that the patient is ready for discharge.       Colene has an appt with the NP at her PCP's office scheduled for Friday,  12/07/2015 at 1130 AM.  [address], [place], [place] [address]  [phone]       The patient will be transported by  her family per private car to her home.        PASARR/HENS 7000 Completed: N/A    DC Summary and COC have been faxed to facility.     The plan has been reviewed: yes    Patient/Family Informed of Discharge Plan: Yes    Plan Reviewed With Patient, Family, or Significant Other: Yes    Patient and or family are aware and in agreement with the discharge plan: Yes             Plan reviewed with MD and other members of the health care team: Yes  Care Plan Completed: Yes    No further CC. needs.    Morey Hummingbird, RN, Care Coordinator  [facility]    Care Management Department    Office :        [phone]         Cell:             [phone]  After 5PM:    [phone])        This plan has been reviewed with the multi-disciplinary team.

## 2015-12-05 NOTE — Unmapped (Signed)
You were seen today for your cellulitis in the emergency department.  Please take your antibiotics as prescribed.    Any new or worsening symptoms, or any other concerns please return to the ED immediately.  Otherwise, please follow-up with your primary care physician.        You have an appt. with your Primary Care Doctor----->    Appt is for Friday 12/07/2015 at 1130 AM  You will be seeing the Nurse Practitioner, Effie Berkshire  [address]   [address]   [place] [address]   [phone]      ??

## 2015-12-05 NOTE — Unmapped (Signed)
[facility]  Care Management Department    High Risk Screen    Name: KARUNA BALDUCCI  MRN:  [id number]  Date:   12/05/2015     High Risk Screen  Patient admitted from nursing home, group home or rehab facility: No  Patient is over 28 years of age and lives alone: No  Patient is active with Hospice services: No  Patient is suspected victim of abuse, neglect, violence: No  Current alcohol or drug abuse: No  Patient is homeless: No  Patient is from justice center or on police hold: No  Patient has a new diagnosis of a terminal illness: No  Patient has a new diagnosis of a chronic illness: No  Patient has multiple co-morbidities and/or>5 home medications: No  Patient has no PCP or medical home: No  Patient has history of dementia, mental illness or DD: No  Patient has no payer source: No  Patient has previous admission within last 30 days: No  Anticipate specialized home health needs, i.e., wounds, trach, cellulitis, ostomy, tube-feeds, TPN, DME: No  Score: 0    Assignment of Risk Level      Patient has been screened by Care Management and meets Low Risk indicators. Please call Care Coordinator if needs arise. (Score 0-1)    Morey Hummingbird, RN, Care Coordinator  [facility]    Care Management Department    Office :        [phone]         Cell:             [phone]  After 5PM:    [phone])

## 2015-12-05 NOTE — Unmapped (Signed)
[facility] Clinical Decision Unit  [facility]  Progress Note       Subjective      TAKITA RIECKE has been admitted to the CDU for 12 hours.  Serial assessments of her clinical progress include:    - Upon reexamination the patient is resting comfortably with no complaints            Physical examination     Vital signs:   Filed Vitals:    12/05/15 0559   BP: 117/59   Pulse: 81   Temp: 98.4 ??F (36.9 ??C)   Resp: 16   SpO2: 98%       Musculoskeletal: distal pulses 2+ bilaterally of upper and lower extremities. On examination she has shown slight improvement in most areas of cellulitis. There is a small area, approximately 2 cm along the medial side that has slightly progressed      Skin: warm and dry to touch    Neurologic: alert and oriented x4         Diagnostic evaluation      1) Additional diagnostic data is pending at the time of this progress note.       Assessment      DANAYA GEDDIS continues to be managed in accordance with the CDU clinical guidelines for cellulitis.  An update of her clinical problem list includes:    1) vital signs stable  2) has shown slight improvement in most areas of cellulitis       Plan      1) continue elevation  2) continue antibiotics  3) reassess area of cellulitis

## 2016-01-14 ENCOUNTER — Telehealth: Payer: Self-pay | Admitting: Obstetrics & Gynecology

## 2016-01-14 NOTE — Telephone Encounter (Signed)
Pt c/o thick white discharge x 2 days. Pt states has tried OTC ointment from [facility].   Pt requesting Rx for yeast.

## 2016-01-15 ENCOUNTER — Other Ambulatory Visit: Payer: Self-pay | Admitting: Obstetrics & Gynecology

## 2016-01-15 MED ORDER — FLUCONAZOLE 150 MG PO TABS: 150.0000 mg | ORAL_TABLET | Freq: Once | ORAL | 0 refills | Status: AC

## 2016-04-15 ENCOUNTER — Ambulatory Visit (INDEPENDENT_AMBULATORY_CARE_PROVIDER_SITE_OTHER): Payer: 59 | Admitting: Adult Health

## 2016-04-15 ENCOUNTER — Encounter: Payer: Self-pay | Admitting: Adult Health

## 2016-04-15 VITALS — BP 130/80 | HR 102 | Ht 68.0 in | Wt 192.0 lb

## 2016-04-15 DIAGNOSIS — Z113 Encounter for screening for infections with a predominantly sexual mode of transmission: Secondary | ICD-10-CM

## 2016-04-15 NOTE — Patient Instructions (Signed)
Use condoms Follow up prn 

## 2016-04-15 NOTE — Progress Notes (Signed)
Subjective:     Patient ID: Karina Clayton, female   DOB: 04/05/1986, 30 y.o.   MRN: [id number]  HPI Karina Clayton is a 30 year old black female in requesting STD testing, has no complaints, is happy with her pills and uses condoms.She says she just likes to be checked ever so often.  Review of Systems Patient denies any headaches, hearing loss, fatigue, blurred vision, shortness of breath, chest pain, abdominal pain, problems with bowel movements, urination, or intercourse. No joint pain or mood swings. Reviewed past medical,surgical, social and family history. Reviewed medications and allergies.     Objective:   Physical Exam BP 130/80 (BP Location: Left Arm, Patient Position: Sitting, Cuff Size: Normal)   Pulse (!) 102   Ht 5\' 8"  (1.727 m)   Wt 192 lb (87.1 kg)   LMP 04/01/2016 (Approximate)   BMI 29.19 kg/m  Skin warm and dry.  Lungs: clear to ausculation bilaterally. Cardiovascular: regular rate and rhythm.Pelvic: external genitalia is normal in appearance no lesions,has clit ring, vagina: scant discharge without odor,urethra has no lesions or masses noted, cervix:smooth, uterus: normal size, shape and contour, non tender, no masses felt, adnexa: no masses or tenderness noted. Bladder is non tender and no masses felt.  GC/CHL obtained.    PHQ 2 score 0. Continue using condoms.  Assessment:     1. Screening for STD (sexually transmitted disease)       Plan:     GC/CHL sent Check HIV and RPR Use condoms Follow up prn

## 2016-04-16 LAB — RPR: RPR: NONREACTIVE

## 2016-04-16 LAB — HIV ANTIBODY (ROUTINE TESTING W REFLEX): HIV SCREEN 4TH GENERATION: NONREACTIVE

## 2016-04-17 LAB — GC/CHLAMYDIA PROBE AMP
CHLAMYDIA, DNA PROBE: NEGATIVE
Neisseria gonorrhoeae by PCR: NEGATIVE

## 2016-07-03 ENCOUNTER — Ambulatory Visit: Payer: 59 | Admitting: Obstetrics and Gynecology

## 2016-09-27 ENCOUNTER — Other Ambulatory Visit: Payer: Self-pay | Admitting: Obstetrics & Gynecology

## 2016-10-14 ENCOUNTER — Telehealth: Payer: Self-pay | Admitting: *Deleted

## 2016-10-15 ENCOUNTER — Other Ambulatory Visit: Payer: Self-pay | Admitting: Obstetrics & Gynecology

## 2016-10-15 MED ORDER — FLUCONAZOLE 150 MG PO TABS: 150.0000 mg | ORAL_TABLET | Freq: Once | ORAL | 0 refills | Status: AC

## 2016-10-15 NOTE — Telephone Encounter (Signed)
Informed patient that prescription was sent to pharmacy.  

## 2016-11-17 ENCOUNTER — Ambulatory Visit (INDEPENDENT_AMBULATORY_CARE_PROVIDER_SITE_OTHER): Payer: 59 | Admitting: Adult Health

## 2016-11-17 ENCOUNTER — Other Ambulatory Visit (HOSPITAL_COMMUNITY)
Admission: RE | Admit: 2016-11-17 | Discharge: 2016-11-17 | Disposition: A | Discharge status: Home / Self Care | Payer: 59 | Source: Ambulatory Visit | Attending: Obstetrics & Gynecology | Admitting: Obstetrics & Gynecology

## 2016-11-17 ENCOUNTER — Other Ambulatory Visit: Payer: 59 | Admitting: Obstetrics & Gynecology

## 2016-11-17 ENCOUNTER — Encounter: Payer: Self-pay | Admitting: Adult Health

## 2016-11-17 VITALS — BP 116/62 | HR 94 | Ht 67.0 in | Wt 180.5 lb

## 2016-11-17 DIAGNOSIS — Z3041 Encounter for surveillance of contraceptive pills: Secondary | ICD-10-CM | POA: Diagnosis not present

## 2016-11-17 DIAGNOSIS — Z01419 Encounter for gynecological examination (general) (routine) without abnormal findings: Secondary | ICD-10-CM

## 2016-11-17 DIAGNOSIS — Z113 Encounter for screening for infections with a predominantly sexual mode of transmission: Secondary | ICD-10-CM | POA: Diagnosis not present

## 2016-11-17 DIAGNOSIS — Z3202 Encounter for pregnancy test, result negative: Secondary | ICD-10-CM | POA: Diagnosis not present

## 2016-11-17 LAB — POCT URINE PREGNANCY: PREG TEST UR: NEGATIVE

## 2016-11-17 NOTE — Progress Notes (Signed)
Patient ID: Karina Clayton, female   DOB: 11/25/1985, 31 y.o.   MRN: [id number] History of Present Illness: Karina Clayton is a [age] year old black female in for well woman gyn exam and pap, last pap was ASCUS with negative HPV 11/15/15. She requests STD screening.   Current Medications, Allergies, Past Medical History, Past Surgical History, Family History and Social History were reviewed in [facility] electronic medical record.     Review of Systems: Patient denies any headaches, hearing loss, fatigue, blurred vision, shortness of breath, chest pain, abdominal pain, problems with bowel movements, urination, or intercourse. No joint pain or mood swings.Periods irregular at times.     Physical Exam:BP 116/62 (BP Location: Left Arm, Patient Position: Sitting, Cuff Size: Normal)   Pulse 94   Ht 5\' 7"  (1.702 m)   Wt 180 lb 8 oz (81.9 kg)   BMI 28.27 kg/m  UPT negative. General:  Well developed, well nourished, no acute distress Skin:  Warm and dry Neck:  Midline trachea, normal thyroid, good ROM, no lymphadenopathy Lungs; Clear to auscultation bilaterally Breast:  No dominant palpable mass, retraction, or nipple discharge Cardiovascular: Regular rate and rhythm Abdomen:  Soft, non tender, no hepatosplenomegaly,has navel piercing Pelvic:  External genitalia is normal in appearance, no lesions. Has clit ring The vagina is normal in appearance. Urethra has no lesions or masses. The cervix is bulbous. Pap with GC/CHL and reflex HPV performed. Uterus is felt to be normal size, shape, and contour.  No adnexal masses or tenderness noted.Bladder is non tender, no masses felt. Extremities/musculoskeletal:  No swelling or varicosities noted, no clubbing or cyanosis Psych:  No mood changes, alert and cooperative,seems happy PHQ 2 score 0.Discussed may not have period on OCs at times.   Impression: 1. Encounter for gynecological examination with Papanicolaou smear of cervix   2. Encounter for surveillance  of contraceptive pills   3. Pregnancy examination or test, negative result   4. Screening for STD (sexually transmitted disease)       Plan: Check HIV and RPR Continue OCs, has refills Physical in 1 year, pap in 3 if normal

## 2016-11-18 LAB — RPR: RPR: NONREACTIVE

## 2016-11-18 LAB — HIV ANTIBODY (ROUTINE TESTING W REFLEX): HIV SCREEN 4TH GENERATION: NONREACTIVE

## 2016-11-20 LAB — CYTOLOGY - PAP
Adequacy: ABSENT
Chlamydia: NEGATIVE
DIAGNOSIS: NEGATIVE
NEISSERIA GONORRHEA: NEGATIVE

## 2017-01-07 ENCOUNTER — Telehealth: Payer: Self-pay | Admitting: Adult Health

## 2017-01-07 NOTE — Telephone Encounter (Signed)
Patient called stating that she is having some symptoms of an infection and wold like to know if Victorino DikeJennifer could call her in an antibiotic. Please contact pt

## 2017-01-07 NOTE — Telephone Encounter (Signed)
Phone not ringing. 

## 2017-01-07 NOTE — Telephone Encounter (Signed)
Phone rings once then nothing.

## 2017-01-08 ENCOUNTER — Telehealth: Payer: Self-pay | Admitting: *Deleted

## 2017-01-08 MED ORDER — FLUCONAZOLE 150 MG PO TABS: ORAL_TABLET | ORAL | 1 refills | Status: DC

## 2017-01-08 NOTE — Telephone Encounter (Signed)
Patient called with complaints of milky white discharge, itching, slight odor. Feels like a yeast infection. States she has not tried anything over the counter because it does not normally work for her. Please advise.

## 2017-01-08 NOTE — Telephone Encounter (Signed)
Phone busy, rx sent for diflucan

## 2017-05-06 ENCOUNTER — Ambulatory Visit (INDEPENDENT_AMBULATORY_CARE_PROVIDER_SITE_OTHER): Payer: 59 | Admitting: Adult Health

## 2017-05-06 ENCOUNTER — Encounter: Payer: Self-pay | Admitting: Adult Health

## 2017-05-06 VITALS — BP 122/80 | HR 95 | Ht 67.75 in | Wt 185.0 lb

## 2017-05-06 DIAGNOSIS — B9689 Other specified bacterial agents as the cause of diseases classified elsewhere: Secondary | ICD-10-CM | POA: Diagnosis not present

## 2017-05-06 DIAGNOSIS — Z3041 Encounter for surveillance of contraceptive pills: Secondary | ICD-10-CM | POA: Diagnosis not present

## 2017-05-06 DIAGNOSIS — N898 Other specified noninflammatory disorders of vagina: Secondary | ICD-10-CM | POA: Diagnosis not present

## 2017-05-06 DIAGNOSIS — N760 Acute vaginitis: Secondary | ICD-10-CM

## 2017-05-06 DIAGNOSIS — Z113 Encounter for screening for infections with a predominantly sexual mode of transmission: Secondary | ICD-10-CM | POA: Diagnosis not present

## 2017-05-06 LAB — POCT WET PREP (WET MOUNT)
Clue Cells Wet Prep Whiff POC: POSITIVE
WBC, Wet Prep HPF POC: POSITIVE

## 2017-05-06 MED ORDER — NORETHIN-ETH ESTRAD-FE BIPHAS 1 MG-10 MCG / 10 MCG PO TABS: 1.0000 | ORAL_TABLET | Freq: Every day | ORAL | 11 refills | Status: DC

## 2017-05-06 MED ORDER — METRONIDAZOLE 500 MG PO TABS: 500.0000 mg | ORAL_TABLET | Freq: Two times a day (BID) | ORAL | 0 refills | Status: DC

## 2017-05-06 NOTE — Progress Notes (Addendum)
Subjective:     Patient ID: Karina Clayton, female   DOB: 05/09/1986, 31 y.o.   MRN: [id number]  HPI Karina Clayton is a 31 year old black female in complaining of vaginal discharge with odor for last 2-3 days.   Review of Systems +vaginal discharge with odor for 2-3 days +headaches when at end of active pills No new partners, but does take tub bath  Reviewed past medical,surgical, social and family history. Reviewed medications and allergies.     Objective:   Physical Exam BP 122/80 (BP Location: Left Arm, Patient Position: Sitting, Cuff Size: Normal)   Pulse 95   Ht 5' 7.75" (1.721 m)   Wt 185 lb (83.9 kg)   LMP 04/22/2017 (Approximate)   BMI 28.34 kg/m   Skin warm and dry.Pelvic: external genitalia is normal in appearance no lesions,has clit ring, vagina: white discharge with odor,urethra has no lesions or masses noted, cervix:smooth and bulbous, uterus: normal size, shape and contour, non tender, no masses felt, adnexa: no masses or tenderness noted. Bladder is non tender and no masses felt. Wet prep: + for clue cells and +WBCs. GC/CHL obtained.  Will change OCs to lo loestrin to se if helps with headaches.    Assessment:     1. Vaginal discharge   2. BV (bacterial vaginosis)   3. Screening for STD (sexually transmitted disease)   4. Encounter for surveillance of contraceptive pills       Plan:    GC/CHL sent Check HIV Rx flagyl 500 mg 1 bid x 7 days, no alcohol, review handout on BV    Rx lo loestrin disp 1 pack take 1 daily with 11 refills, discount card and 1 pack given Use condoms Sit in water only, no bubbles or bombs F/U prn

## 2017-05-06 NOTE — Patient Instructions (Signed)
Bacterial Vaginosis Bacterial vaginosis is a vaginal infection that occurs when the normal balance of bacteria in the vagina is disrupted. It results from an overgrowth of certain bacteria. This is the most common vaginal infection among women ages 15-44. Because bacterial vaginosis increases your risk for STIs (sexually transmitted infections), getting treated can help reduce your risk for chlamydia, gonorrhea, herpes, and HIV (human immunodeficiency virus). Treatment is also important for preventing complications in pregnant women, because this condition can cause an early (premature) delivery. What are the causes? This condition is caused by an increase in harmful bacteria that are normally present in small amounts in the vagina. However, the reason that the condition develops is not fully understood. What increases the risk? The following factors may make you more likely to develop this condition:  Having a new sexual partner or multiple sexual partners.  Having unprotected sex.  Douching.  Having an intrauterine device (IUD).  Smoking.  Drug and alcohol abuse.  Taking certain antibiotic medicines.  Being pregnant.  You cannot get bacterial vaginosis from toilet seats, bedding, swimming pools, or contact with objects around you. What are the signs or symptoms? Symptoms of this condition include:  Grey or white vaginal discharge. The discharge can also be watery or foamy.  A fish-like odor with discharge, especially after sexual intercourse or during menstruation.  Itching in and around the vagina.  Burning or pain with urination.  Some women with bacterial vaginosis have no signs or symptoms. How is this diagnosed? This condition is diagnosed based on:  Your medical history.  A physical exam of the vagina.  Testing a sample of vaginal fluid under a microscope to look for a large amount of bad bacteria or abnormal cells. Your health care provider may use a cotton swab  or a small wooden spatula to collect the sample.  How is this treated? This condition is treated with antibiotics. These may be given as a pill, a vaginal cream, or a medicine that is put into the vagina (suppository). If the condition comes back after treatment, a second round of antibiotics may be needed. Follow these instructions at home: Medicines  Take over-the-counter and prescription medicines only as told by your health care provider.  Take or use your antibiotic as told by your health care provider. Do not stop taking or using the antibiotic even if you start to feel better. General instructions  If you have a female sexual partner, tell her that you have a vaginal infection. She should see her health care provider and be treated if she has symptoms. If you have a female sexual partner, he does not need treatment.  During treatment: ? Avoid sexual activity until you finish treatment. ? Do not douche. ? Avoid alcohol as directed by your health care provider. ? Avoid breastfeeding as directed by your health care provider.  Drink enough water and fluids to keep your urine clear or pale yellow.  Keep the area around your vagina and rectum clean. ? Wash the area daily with warm water. ? Wipe yourself from front to back after using the toilet.  Keep all follow-up visits as told by your health care provider. This is important. How is this prevented?  Do not douche.  Wash the outside of your vagina with warm water only.  Use protection when having sex. This includes latex condoms and dental dams.  Limit how many sexual partners you have. To help prevent bacterial vaginosis, it is best to have sex with just   one partner (monogamous).  Make sure you and your sexual partner are tested for STIs.  Wear cotton or cotton-lined underwear.  Avoid wearing tight pants and pantyhose, especially during summer.  Limit the amount of alcohol that you drink.  Do not use any products that  contain nicotine or tobacco, such as cigarettes and e-cigarettes. If you need help quitting, ask your health care provider.  Do not use illegal drugs. Where to find more information:  Centers for Disease Control and Prevention: www.cdc.gov/std  American Sexual Health Association (ASHA): www.ashastd.org  U.S. Department of Health and Human Services, Office on Women's Health: www.womenshealth.gov/ or https://www.womenshealth.gov/a-z-topics/bacterial-vaginosis Contact a health care provider if:  Your symptoms do not improve, even after treatment.  You have more discharge or pain when urinating.  You have a fever.  You have pain in your abdomen.  You have pain during sex.  You have vaginal bleeding between periods. Summary  Bacterial vaginosis is a vaginal infection that occurs when the normal balance of bacteria in the vagina is disrupted.  Because bacterial vaginosis increases your risk for STIs (sexually transmitted infections), getting treated can help reduce your risk for chlamydia, gonorrhea, herpes, and HIV (human immunodeficiency virus). Treatment is also important for preventing complications in pregnant women, because the condition can cause an early (premature) delivery.  This condition is treated with antibiotic medicines. These may be given as a pill, a vaginal cream, or a medicine that is put into the vagina (suppository). This information is not intended to replace advice given to you by your health care provider. Make sure you discuss any questions you have with your health care provider. Document Released: 05/26/2005 Document Revised: 02/09/2016 Document Reviewed: 02/09/2016 Elsevier Interactive Patient Education  2017 [employer]  

## 2017-05-07 LAB — HIV ANTIBODY (ROUTINE TESTING W REFLEX): HIV Screen 4th Generation wRfx: NONREACTIVE

## 2017-05-08 LAB — GC/CHLAMYDIA PROBE AMP
Chlamydia trachomatis, NAA: NEGATIVE
Neisseria gonorrhoeae by PCR: NEGATIVE

## 2017-07-30 ENCOUNTER — Other Ambulatory Visit: Payer: Self-pay | Admitting: Obstetrics & Gynecology

## 2018-01-21 ENCOUNTER — Encounter: Payer: Self-pay | Admitting: Obstetrics & Gynecology

## 2018-02-11 ENCOUNTER — Other Ambulatory Visit: Payer: 59 | Admitting: Obstetrics & Gynecology

## 2018-02-19 ENCOUNTER — Encounter: Payer: Self-pay | Admitting: Obstetrics & Gynecology

## 2018-02-19 ENCOUNTER — Other Ambulatory Visit (HOSPITAL_COMMUNITY)
Admission: RE | Admit: 2018-02-19 | Discharge: 2018-02-19 | Disposition: A | Discharge status: Home / Self Care | Payer: 59 | Source: Ambulatory Visit | Attending: Obstetrics & Gynecology | Admitting: Obstetrics & Gynecology

## 2018-02-19 ENCOUNTER — Ambulatory Visit (INDEPENDENT_AMBULATORY_CARE_PROVIDER_SITE_OTHER): Payer: 59 | Admitting: Obstetrics & Gynecology

## 2018-02-19 VITALS — BP 131/89 | HR 106 | Ht 67.5 in | Wt 209.0 lb

## 2018-02-19 DIAGNOSIS — Z3202 Encounter for pregnancy test, result negative: Secondary | ICD-10-CM

## 2018-02-19 DIAGNOSIS — Z01411 Encounter for gynecological examination (general) (routine) with abnormal findings: Secondary | ICD-10-CM | POA: Insufficient documentation

## 2018-02-19 DIAGNOSIS — Z01419 Encounter for gynecological examination (general) (routine) without abnormal findings: Secondary | ICD-10-CM | POA: Diagnosis not present

## 2018-02-19 DIAGNOSIS — N911 Secondary amenorrhea: Secondary | ICD-10-CM | POA: Diagnosis not present

## 2018-02-19 DIAGNOSIS — Z1151 Encounter for screening for human papillomavirus (HPV): Secondary | ICD-10-CM | POA: Insufficient documentation

## 2018-02-19 DIAGNOSIS — Z7251 High risk heterosexual behavior: Secondary | ICD-10-CM

## 2018-02-19 LAB — POCT URINE PREGNANCY: Preg Test, Ur: NEGATIVE

## 2018-02-19 MED ORDER — MEDROXYPROGESTERONE ACETATE 10 MG PO TABS: 10.0000 mg | ORAL_TABLET | Freq: Every day | ORAL | 0 refills | Status: DC

## 2018-02-19 NOTE — Progress Notes (Signed)
Subjective:     Karina Clayton is a 32 y.o. female here for a routine exam.  No LMP recorded. (Menstrual status: Other). G1P1001 Birth Control Method:  none Menstrual Calendar(currently): amenorrheic for the past 5 months, pregnancy test is negative  Current complaints: amenorrhea.   Current acute medical issues:  none   Recent Gynecologic History No LMP recorded. (Menstrual status: Other). Last Pap: 2018,  normal Last mammogram: ,    Past Medical History:  Diagnosis Date  . Abnormal Pap smear   . BV (bacterial vaginosis) 04/12/2014  . Contraceptive management 05/16/2013  . Eczema   . Headache(784.0)    migraines with aura  . Psoriasis   . Screening for STD (sexually transmitted disease) 05/16/2013  . Vaginal discharge 04/12/2014  . Vaginal Pap smear, abnormal     Past Surgical History:  Procedure Laterality Date  . COLPOSCOPY W/ BIOPSY / CURETTAGE    . NO PAST SURGERIES      OB History    Gravida  1   Para  1   Term  1   Preterm      AB      Living  1     SAB      TAB      Ectopic      Multiple      Live Births  1           Social History   Socioeconomic History  . Marital status: Single    Spouse name: Not on file  . Number of children: Not on file  . Years of education: Not on file  . Highest education level: Not on file  Occupational History  . Not on file  Social Needs  . Financial resource strain: Not on file  . Food insecurity:    Worry: Not on file    Inability: Not on file  . Transportation needs:    Medical: Not on file    Non-medical: Not on file  Tobacco Use  . Smoking status: Never Smoker  . Smokeless tobacco: Never Used  . Tobacco comment: never used snuff or chewing tobacco.  Substance and Sexual Activity  . Alcohol use: No  . Drug use: No  . Sexual activity: Yes    Birth control/protection: Condom  Lifestyle  . Physical activity:    Days per week: Not on file    Minutes per session: Not on file  . Stress: Not  on file  Relationships  . Social connections:    Talks on phone: Not on file    Gets together: Not on file    Attends religious service: Not on file    Active member of club or organization: Not on file    Attends meetings of clubs or organizations: Not on file    Relationship status: Not on file  Other Topics Concern  . Not on file  Social History Narrative  . Not on file    Family History  Problem Relation Age of Onset  . Coronary artery disease Paternal Grandfather   . Coronary artery disease Paternal Grandmother   . Hypertension Maternal Grandmother   . Cancer Maternal Grandfather        stomach  . Bipolar disorder Sister   . Hypertension Sister   . Bipolar disorder Sister   . Cancer Maternal Uncle        prostate  . Multiple sclerosis Maternal Aunt   . Hypertension Maternal Aunt   . Diabetes Other  paternal great uncle     Current Outpatient Medications:  .  cetirizine (ZYRTEC) 10 MG tablet, Take 10 mg by mouth as needed. , Disp: , Rfl:  .  diphenhydrAMINE (BENADRYL) 25 MG tablet, Take 2 tablets (50 mg total) by mouth every 6 (six) hours. Take 1-2 tablets every 6 hours x 2 days, then space out to an as needed basis, Disp: 20 tablet, Rfl: 0 .  medroxyPROGESTERone (PROVERA) 10 MG tablet, Take 1 tablet (10 mg total) by mouth daily., Disp: 10 tablet, Rfl: 0  Review of Systems  Review of Systems  Constitutional: Negative for fever, chills, weight loss, malaise/fatigue and diaphoresis.  HENT: Negative for hearing loss, ear pain, nosebleeds, congestion, sore throat, neck pain, tinnitus and ear discharge.   Eyes: Negative for blurred vision, double vision, photophobia, pain, discharge and redness.  Respiratory: Negative for cough, hemoptysis, sputum production, shortness of breath, wheezing and stridor.   Cardiovascular: Negative for chest pain, palpitations, orthopnea, claudication, leg swelling and PND.  Gastrointestinal: negative for abdominal pain. Negative for  heartburn, nausea, vomiting, diarrhea, constipation, blood in stool and melena.  Genitourinary: Negative for dysuria, urgency, frequency, hematuria and flank pain.  Musculoskeletal: Negative for myalgias, back pain, joint pain and falls.  Skin: Negative for itching and rash.  Neurological: Negative for dizziness, tingling, tremors, sensory change, speech change, focal weakness, seizures, loss of consciousness, weakness and headaches.  Endo/Heme/Allergies: Negative for environmental allergies and polydipsia. Does not bruise/bleed easily.  Psychiatric/Behavioral: Negative for depression, suicidal ideas, hallucinations, memory loss and substance abuse. The patient is not nervous/anxious and does not have insomnia.        Objective:  Blood pressure 131/89, pulse (!) 106, height 5' 7.5" (1.715 m), weight 209 lb (94.8 kg).   Physical Exam  Vitals reviewed. Constitutional: She is oriented to person, place, and time. She appears well-developed and well-nourished.  HENT:  Head: Normocephalic and atraumatic.        Right Ear: External ear normal.  Left Ear: External ear normal.  Nose: Nose normal.  Mouth/Throat: Oropharynx is clear and moist.  Eyes: Conjunctivae and EOM are normal. Pupils are equal, round, and reactive to light. Right eye exhibits no discharge. Left eye exhibits no discharge. No scleral icterus.  Neck: Normal range of motion. Neck supple. No tracheal deviation present. No thyromegaly present.  Cardiovascular: Normal rate, regular rhythm, normal heart sounds and intact distal pulses.  Exam reveals no gallop and no friction rub.   No murmur heard. Respiratory: Effort normal and breath sounds normal. No respiratory distress. She has no wheezes. She has no rales. She exhibits no tenderness.  GI: Soft. Bowel sounds are normal. She exhibits no distension and no mass. There is no tenderness. There is no rebound and no guarding.  Genitourinary:  Breasts no masses skin changes or nipple  changes bilaterally      Vulva is normal without lesions Vagina is pink moist without discharge Cervix normal in appearance and pap is done Uterus is normal size shape and contour Adnexa is negative with normal sized ovaries   Musculoskeletal: Normal range of motion. She exhibits no edema and no tenderness.  Neurological: She is alert and oriented to person, place, and time. She has normal reflexes. She displays normal reflexes. No cranial nerve deficit. She exhibits normal muscle tone. Coordination normal.  Skin: Skin is warm and dry. No rash noted. No erythema. No pallor.  Psychiatric: She has a normal mood and affect. Her behavior is normal. Judgment and thought content  normal.       Medications Ordered at today's visit: Meds ordered this encounter  Medications  . medroxyPROGESTERone (PROVERA) 10 MG tablet    Sig: Take 1 tablet (10 mg total) by mouth daily.    Dispense:  10 tablet    Refill:  0    Other orders placed at today's visit: Orders Placed This Encounter  Procedures  . RPR  . HIV Antibody (routine testing w rflx)  . Hepatitis C Antibody  . POCT urine pregnancy      Assessment:    Healthy female exam.   secondary amenorrhea mst likely due to post pills Wants STI check Plan:    provera x 10 days and see if has withdrawal bleeding If does this again investigate with lab data   Pt will contact me via my chart if she has another 3 months without menses  Return in about 1 year (around 02/20/2019) for yearly.

## 2018-02-20 LAB — RPR: RPR Ser Ql: NONREACTIVE

## 2018-02-20 LAB — HEPATITIS C ANTIBODY: HEP C VIRUS AB: 0.1 {s_co_ratio} (ref 0.0–0.9)

## 2018-02-20 LAB — HIV ANTIBODY (ROUTINE TESTING W REFLEX): HIV Screen 4th Generation wRfx: NONREACTIVE

## 2018-02-22 LAB — CYTOLOGY - PAP
Chlamydia: NEGATIVE
Diagnosis: NEGATIVE
HPV: NOT DETECTED
NEISSERIA GONORRHEA: NEGATIVE
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
## 2018-05-13 ENCOUNTER — Other Ambulatory Visit: Payer: Self-pay | Admitting: Obstetrics & Gynecology

## 2018-09-07 ENCOUNTER — Encounter (INDEPENDENT_AMBULATORY_CARE_PROVIDER_SITE_OTHER): Payer: Self-pay | Admitting: Internal Medicine

## 2019-02-22 ENCOUNTER — Other Ambulatory Visit: Payer: Self-pay

## 2019-02-22 ENCOUNTER — Encounter: Payer: Self-pay | Admitting: Obstetrics and Gynecology

## 2019-02-22 ENCOUNTER — Other Ambulatory Visit: Payer: 59 | Admitting: Adult Health

## 2019-02-22 ENCOUNTER — Ambulatory Visit (INDEPENDENT_AMBULATORY_CARE_PROVIDER_SITE_OTHER): Payer: 59 | Admitting: Obstetrics and Gynecology

## 2019-02-22 VITALS — BP 145/88 | HR 105 | Ht 68.0 in | Wt 220.2 lb

## 2019-02-22 DIAGNOSIS — Z0000 Encounter for general adult medical examination without abnormal findings: Secondary | ICD-10-CM

## 2019-02-22 DIAGNOSIS — N898 Other specified noninflammatory disorders of vagina: Secondary | ICD-10-CM | POA: Diagnosis not present

## 2019-02-22 LAB — POCT WET PREP (WET MOUNT): Trichomonas Wet Prep HPF POC: ABSENT

## 2019-02-22 NOTE — Progress Notes (Signed)
``  Patient ID: Karina Clayton, female   DOB: December 23, 1985, 33 y.o.   MRN: [id number]  Assessment:  Annual Gyn Exam STI screen Plan:  1. pap NOT smear done, next pap due 2 years 2. return annually or prn 3    Annual mammogram advised after age 73 Subjective:  KENNETTE Clayton is a 33 y.o. female G1P1001 who presents for annual exam. Patient's last menstrual period was 02/08/2019 (approximate). The patient has complaints today of odorous vaginal smell 9/12-13/2020. Wants to check for BV has had it in the past. Would like STI screening as well.   The following portions of the patient's history were reviewed and updated as appropriate: allergies, current medications, past family history, past medical history, past social history, past surgical history and problem list. Past Medical History:  Diagnosis Date   Abnormal Pap smear    BV (bacterial vaginosis) 04/12/2014   Contraceptive management 05/16/2013   Eczema    Headache(784.0)    migraines with aura   Psoriasis    Screening for STD (sexually transmitted disease) 05/16/2013   Vaginal discharge 04/12/2014   Vaginal Pap smear, abnormal     Past Surgical History:  Procedure Laterality Date   COLPOSCOPY W/ BIOPSY / CURETTAGE     NO PAST SURGERIES       Current Outpatient Medications:    APRI 0.15-30 MG-MCG tablet, TAKE 1 TABLET BY MOUTH  DAILY, Disp: 84 tablet, Rfl: 3   cetirizine (ZYRTEC) 10 MG tablet, Take 10 mg by mouth as needed. , Disp: , Rfl:    diphenhydrAMINE (BENADRYL) 25 MG tablet, Take 2 tablets (50 mg total) by mouth every 6 (six) hours. Take 1-2 tablets every 6 hours x 2 days, then space out to an as needed basis, Disp: 20 tablet, Rfl: 0  Review of Systems Constitutional: negative Gastrointestinal: negative Genitourinary: normal  Objective:  BP (!) 145/88 (BP Location: Right Arm, Patient Position: Sitting, Cuff Size: Large)    Pulse (!) 105    Ht 5\' 8"  (1.727 m)    Wt 220 lb 3.2 oz (99.9 kg)    LMP 02/08/2019  (Approximate)    BMI 33.48 kg/m    BMI: Body mass index is 33.48 kg/m.  General Appearance: Alert, appropriate appearance for age. No acute distress HEENT: Grossly normal Neck / Thyroid:  Cardiovascular: RRR; normal S1, S2, no murmur Lungs: CTA bilaterally Back: No CVAT Breast Exam: Not examined Gastrointestinal: Soft, non-tender, no masses or organomegaly Pelvic Exam:   VAGINA: normal appearing vagina with normal color and discharge, no lesions CERVIX: normal appearing cervix UTERUS: uterus is normal size, shape, consistency and nontender, PAP: Pap NOT smear done today. GCCHL collected Wet Prep: rare clue cells neg trich neg yeast KOH: neg yeast Lymphatic Exam: Non-palpable nodes in neck, clavicular, axillary, or inguinal regions Skin: no rash or abnormalities Neurologic: Normal gait and speech, no tremor  Psychiatric: Alert and oriented, appropriate affect. coexam with Dr Maudie Mercury Urinalysis:Not done  By signing my name below, I, Samul Dada, attest that this documentation has been prepared under the direction and in the presence of Jonnie Kind, MD. Electronically Signed: [place]. 02/22/19. 4:56 PM.  I personally performed the services described in this documentation, which was SCRIBED in my presence. The recorded information has been reviewed and considered accurate. It has been edited as necessary during review. Jonnie Kind, MD

## 2019-02-26 LAB — GC/CHLAMYDIA PROBE AMP
Chlamydia trachomatis, NAA: NEGATIVE
Neisseria Gonorrhoeae by PCR: NEGATIVE

## 2019-03-21 ENCOUNTER — Other Ambulatory Visit: Payer: Self-pay | Admitting: Obstetrics & Gynecology

## 2019-06-06 ENCOUNTER — Other Ambulatory Visit: Payer: Self-pay

## 2019-06-06 ENCOUNTER — Ambulatory Visit (INDEPENDENT_AMBULATORY_CARE_PROVIDER_SITE_OTHER): Payer: 59 | Admitting: Internal Medicine

## 2019-06-06 ENCOUNTER — Encounter (INDEPENDENT_AMBULATORY_CARE_PROVIDER_SITE_OTHER): Payer: Self-pay | Admitting: Internal Medicine

## 2019-06-06 DIAGNOSIS — N943 Premenstrual tension syndrome: Secondary | ICD-10-CM | POA: Diagnosis not present

## 2019-06-06 DIAGNOSIS — I10 Essential (primary) hypertension: Secondary | ICD-10-CM

## 2019-06-06 DIAGNOSIS — E669 Obesity, unspecified: Secondary | ICD-10-CM | POA: Insufficient documentation

## 2019-06-06 DIAGNOSIS — E559 Vitamin D deficiency, unspecified: Secondary | ICD-10-CM | POA: Insufficient documentation

## 2019-06-06 DIAGNOSIS — E66811 Obesity, class 1: Secondary | ICD-10-CM

## 2019-06-06 HISTORY — DX: Essential (primary) hypertension: I10

## 2019-06-06 HISTORY — DX: Obesity, unspecified: E66.9

## 2019-06-06 HISTORY — DX: Obesity, class 1: E66.811

## 2019-06-06 HISTORY — DX: Premenstrual tension syndrome: N94.3

## 2019-06-06 HISTORY — DX: Vitamin D deficiency, unspecified: E55.9

## 2019-06-06 MED ORDER — PROGESTERONE MICRONIZED 200 MG PO CAPS: 200.0000 mg | ORAL_CAPSULE | Freq: Every day | ORAL | 3 refills | Status: DC

## 2019-06-06 NOTE — Progress Notes (Signed)
Metrics: Intervention Frequency ACO  Documented Smoking Status Yearly  Screened one or more times in 24 months  Cessation Counseling or  Active cessation medication Past 24 months  Past 24 months   Guideline developer: UpToDate (See UpToDate for funding source) Date Released: 2014       Wellness Office Visit  Subjective:  Patient ID: Karina Clayton, female    DOB: 05-Mar-1986  Age: 33 y.o. MRN: [id number]  CC: This lady comes to our office after a long hiatus.  The last time we had seen her was March 2019. She comes because she is slightly concerned about her elevated blood pressure and also she continues to have symptoms of PMS. HPI  In the past, she had been taking progesterone for her symptoms of PMS and this did help her. She has also gained significant weight since the last time we saw her, almost 30 pounds. She does have a history of vitamin D deficiency and has been taking vitamin D3 supplementation every day. Past Medical History:  Diagnosis Date  . Abnormal Pap smear   . BV (bacterial vaginosis) 04/12/2014  . Contraceptive management 05/16/2013  . Eczema   . Essential hypertension, benign 06/06/2019  . Headache(784.0)    migraines with aura  . Obesity (BMI 30.0-34.9) 06/06/2019  . PMS (premenstrual syndrome) 06/06/2019  . Psoriasis   . Screening for STD (sexually transmitted disease) 05/16/2013  . Vaginal discharge 04/12/2014  . Vaginal Pap smear, abnormal   . Vitamin D deficiency disease 06/06/2019      Family History  Problem Relation Age of Onset  . Coronary artery disease Paternal Grandfather   . Coronary artery disease Paternal Grandmother   . Hypertension Maternal Grandmother   . Cancer Maternal Grandfather        stomach  . Bipolar disorder Sister   . Hypertension Sister   . Bipolar disorder Sister   . Cancer Maternal Uncle        prostate  . Multiple sclerosis Maternal Aunt   . Hypertension Maternal Aunt   . Diabetes Other        paternal great  uncle    Social History   Social History Narrative   Single,her son lives with her.Works from [employer] with [employer].   Social History   Tobacco Use  . Smoking status: Never Smoker  . Smokeless tobacco: Never Used  . Tobacco comment: never used snuff or chewing tobacco.  Substance Use Topics  . Alcohol use: Yes    Comment: socially    Current Meds  Medication Sig  . APRI 0.15-30 MG-MCG tablet TAKE 1 TABLET BY MOUTH  DAILY  . Cholecalciferol (VITAMIN D-3) 125 MCG (5000 UT) TABS Take 1 tablet by mouth daily.     Objective:   Today's Vitals: BP (!) 150/90   Pulse 80   Ht 5' 7.75" (1.721 m)   Wt 223 lb 12.8 oz (101.5 kg)   BMI 34.28 kg/m  Vitals with BMI 06/06/2019 02/22/2019 02/19/2018  Height 5' 7.75" 5\' 8"  5' 7.5"  Weight 223 lbs 13 oz 220 lbs 3 oz 209 lbs  BMI 34.27 33.49 32.23  Systolic 150 145  Diastolic 90 88 89  Pulse 80 105 106     Physical Exam   She looks systemically well.  She is obese.  Her blood pressure is not well controlled.  She is alert and orientated without any focal neurological signs.    Assessment   1. Essential hypertension, benign  2. Vitamin D deficiency disease   3. PMS (premenstrual syndrome)   4. Obesity (BMI 30.0-34.9)       Tests ordered No orders of the defined types were placed in this encounter.    Plan: 1. I have prescribed progesterone again for her symptoms of PMS. 2. We discussed the importance of trying to lose weight because her obesity will cause her to have many complications including hypertension.  I discussed the concept of intermittent fasting combined with a more plant-based diet and have given her diet sheet regarding this.  I have told her she must drink plenty of water every day. 3. She will continue with vitamin D3 supplementation as before. 4. I will see her in about 4 to 6 weeks time to see how she is doing and we will do all the blood work then.   Meds ordered this encounter    Medications  . progesterone (PROMETRIUM) 200 MG capsule    Sig: Take 1 capsule (200 mg total) by mouth daily.    Dispense:  30 capsule    Refill:  3    Payne Garske Luther Parody, MD

## 2019-06-06 NOTE — Patient Instructions (Signed)
[facility] Dietary Recommendations for Weight Loss What to Avoid . Avoid added sugars o Often added sugar can be found in processed foods such as many condiments, dry cereals, cakes, cookies, chips, crisps, crackers, candies, sweetened drinks, etc.  o Read labels and AVOID/DECREASE use of foods with the following in their ingredient list: Sugar, fructose, high fructose corn syrup, sucrose, glucose, maltose, dextrose, molasses, cane sugar, brown sugar, any type of syrup, agave nectar, etc.   . Avoid snacking in between meals . Avoid foods made with flour o If you are going to eat food made with flour, choose those made with whole-grains; and, minimize your consumption as much as is tolerable . Avoid processed foods o These foods are generally stocked in the middle of the grocery store. Focus on shopping on the perimeter of the grocery.  . Avoid Meat  o We recommend following a plant-based diet at [facility]. Thus, we recommend avoiding meat as a general rule. Consider eating beans, legumes, eggs, and/or dairy products for regular protein sources o If you plan on eating meat limit to 4 ounces of meat at a time and choose lean options such as Fish, chicken, turkey. Avoid red meat intake such as pork and/or steak What to Include . Vegetables o GREEN LEAFY VEGETABLES: Kale, spinach, mustard greens, collard greens, cabbage, broccoli, etc. o OTHER: Asparagus, cauliflower, eggplant, carrots, peas, Brussel sprouts, tomatoes, bell peppers, zucchini, beets, cucumbers, etc. . Grains, seeds, and legumes o Beans: kidney beans, black eyed peas, garbanzo beans, black beans, pinto beans, etc. o Whole, unrefined grains: brown rice, barley, bulgur, oatmeal, etc. . Healthy fats  o Avoid highly processed fats such as vegetable oil o Examples of healthy fats: avocado, olives, virgin olive oil, dark chocolate (?72% Cocoa), nuts (peanuts, almonds, walnuts, cashews, pecans, etc.) . None to Low  Intake of Animal Sources of Protein o Meat sources: chicken, turkey, salmon, tuna. Limit to 4 ounces of meat at one time. o Consider limiting dairy sources, but when choosing dairy focus on: PLAIN Greek yogurt, cottage cheese, high-protein milk . Fruit o Choose berries  When to Eat . Intermittent Fasting: o Choosing not to eat for a specific time period, but DO FOCUS ON HYDRATION when fasting o Multiple Techniques: - Time Restricted Eating: eat 3 meals in a day, each meal lasting no more than 60 minutes, no snacks between meals - 16-18 hour fast: fast for 16 to 18 hours up to 7 days a week. Often suggested to start with 2-3 nonconsecutive days per week.  . Remember the time you sleep is counted as fasting.  . Examples of eating schedule: Fast from 7:00pm-11:00am. Eat between 11:00am-7:00pm.  - 24-hour fast: fast for 24 hours up to every other day. Often suggested to start with 1 day per week . Remember the time you sleep is counted as fasting . Examples of eating schedule:  o Eating day: eat 2-3 meals on your eating day. If doing 2 meals, each meal should last no more than 90 minutes. If doing 3 meals, each meal should last no more than 60 minutes. Finish last meal by 7:00pm. o Fasting day: Fast until 7:00pm.  o IF YOU FEEL UNWELL FOR ANY REASON/IN ANY WAY WHEN FASTING, STOP FASTING BY EATING A NUTRITIOUS SNACK OR LIGHT MEAL o ALWAYS FOCUS ON HYDRATION DURING FASTS - Acceptable Hydration sources: water, broths, tea/coffee (black tea/coffee is best but using a small amount of whole-fat dairy products in coffee/tea is acceptable).  -   Poor Hydration Sources: anything with sugar or artificial sweeteners added to it  These recommendations have been developed for patients that are actively receiving medical care from either Dr. Lyfe Monger or Sarah Gray, DNP, NP-C at [facility]. These recommendations are developed for patients with specific medical conditions and are not meant to be  distributed or used by others that are not actively receiving care from either provider listed above at [facility]. It is not appropriate to participate in the above eating plans without proper medical supervision.   Reference: Fung, J. The obesity code. [place]: [place]; 2016.   

## 2019-07-11 ENCOUNTER — Ambulatory Visit (INDEPENDENT_AMBULATORY_CARE_PROVIDER_SITE_OTHER): Payer: 59 | Admitting: Internal Medicine

## 2019-08-18 ENCOUNTER — Ambulatory Visit (INDEPENDENT_AMBULATORY_CARE_PROVIDER_SITE_OTHER): Payer: 59 | Admitting: Internal Medicine

## 2019-08-22 ENCOUNTER — Ambulatory Visit (INDEPENDENT_AMBULATORY_CARE_PROVIDER_SITE_OTHER): Payer: 59 | Admitting: Internal Medicine

## 2019-08-22 ENCOUNTER — Other Ambulatory Visit: Payer: Self-pay

## 2019-08-22 ENCOUNTER — Encounter (INDEPENDENT_AMBULATORY_CARE_PROVIDER_SITE_OTHER): Payer: Self-pay | Admitting: Internal Medicine

## 2019-08-22 VITALS — BP 137/82 | HR 82 | Temp 97.6°F | Resp 18 | Ht 68.0 in | Wt 216.0 lb

## 2019-08-22 DIAGNOSIS — E559 Vitamin D deficiency, unspecified: Secondary | ICD-10-CM

## 2019-08-22 DIAGNOSIS — I10 Essential (primary) hypertension: Secondary | ICD-10-CM | POA: Diagnosis not present

## 2019-08-22 DIAGNOSIS — E669 Obesity, unspecified: Secondary | ICD-10-CM

## 2019-08-22 NOTE — Progress Notes (Signed)
Metrics: Intervention Frequency ACO  Documented Smoking Status Yearly  Screened one or more times in 24 months  Cessation Counseling or  Active cessation medication Past 24 months  Past 24 months   Guideline developer: UpToDate (See UpToDate for funding source) Date Released: 2014       Wellness Office Visit  Subjective:  Patient ID: Karina Clayton, female    DOB: [birth year]  Age: 34 y.o. MRN: [id number]  CC: This lady comes in for follow-up of hypertension, obesity, vitamin D deficiency.  HPI  Over the last few weeks, she has done very well and is eating healthier with intermittent fasting and trying to avoid protein-based food from animals.  She is also working out for the last month or so.  As a result, she has managed to lose weight. Symptoms of PMS do not seem to bother her at the present time. Past Medical History:  Diagnosis Date  . Abnormal Pap smear   . BV (bacterial vaginosis) 04/12/2014  . Contraceptive management 05/16/2013  . Eczema   . Essential hypertension, benign 06/06/2019  . Headache(784.0)    migraines with aura  . Obesity (BMI 30.0-34.9) 06/06/2019  . PMS (premenstrual syndrome) 06/06/2019  . Psoriasis   . Screening for STD (sexually transmitted disease) 05/16/2013  . Vaginal discharge 04/12/2014  . Vaginal Pap smear, abnormal   . Vitamin D deficiency disease 06/06/2019      Family History  Problem Relation Age of Onset  . Coronary artery disease Paternal Grandfather   . Coronary artery disease Paternal Grandmother   . Hypertension Maternal Grandmother   . Cancer Maternal Grandfather        stomach  . Bipolar disorder Sister   . Hypertension Sister   . Bipolar disorder Sister   . Cancer Maternal Uncle        prostate  . Multiple sclerosis Maternal Aunt   . Hypertension Maternal Aunt   . 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   Social History   Tobacco Use  . Smoking status: Never Smoker  . Smokeless tobacco: Never Used  . Tobacco comment: never used snuff or chewing tobacco.  Substance Use Topics  . Alcohol use: Yes    Comment: socially    Current Meds  Medication Sig  . Cholecalciferol (VITAMIN D-3) 125 MCG (5000 UT) TABS Take 1 tablet by mouth daily.  . [DISCONTINUED] APRI 0.15-30 MG-MCG tablet TAKE 1 TABLET BY MOUTH  DAILY       Objective:   Today's Vitals: BP 137/82 (BP Location: Right Arm, Patient Position: Sitting, Cuff Size: Normal)   Pulse 82   Temp 97.6 F (36.4 C) (Temporal)   Resp 18   Ht 5\' 8"  (1.727 m)   Wt 216 lb (98 kg)   SpO2 98% Comment: wearing mask  BMI 32.84 kg/m  Vitals with BMI 08/22/2019 06/06/2019 02/22/2019  Height 5\' 8"  5' 7.75" 5\' 8"   Weight 216 lbs 223 lbs 13 oz 220 lbs 3 oz  BMI 32.85 51.02 58.52  Systolic 778 242 353  Diastolic 82 90 88  Pulse 82 80 105     Physical Exam   She looks systemically well.  She has lost 7 pounds since last time I saw her in December.  Her blood pressure is improved significantly without the need for antihypertensive medications.  Assessment   1. Essential hypertension, benign   2. Vitamin D deficiency disease   3. Obesity (BMI 30.0-34.9)       Tests ordered Orders Placed This Encounter  Procedures  . COMPLETE METABOLIC PANEL WITH GFR  . VITAMIN D 25 Hydroxy (Vit-D Deficiency, Fractures)  . Lipid panel     Plan: 1. Her blood pressure/hypertension seems to be controlled without medications. 2. She will continue on vitamin D3 supplementation and we will check levels today. 3. Her obesity is improving and she will continue to focus on nutrition and exercise as before. 4. Further recommendations will depend on blood results and I will see her in about 3 months for an annual physical exam.   No orders of the defined types were placed in this encounter.   Wilson Singer, MD

## 2019-08-23 ENCOUNTER — Encounter (INDEPENDENT_AMBULATORY_CARE_PROVIDER_SITE_OTHER): Payer: Self-pay | Admitting: Internal Medicine

## 2019-08-23 LAB — LIPID PANEL
Cholesterol: 192 mg/dL (ref <200)
HDL: 72 mg/dL (ref >=50)
LDL Cholesterol (Calc): 106 mg/dL (calc) — ABNORMAL HIGH
Non-HDL Cholesterol (Calc): 120 mg/dL (calc) (ref <130)
Total CHOL/HDL Ratio: 2.7 (calc) (ref <5.0)
Triglycerides: 53 mg/dL (ref <150)

## 2019-08-23 LAB — MICROALBUMIN / CREATININE URINE RATIO
Creatinine, Urine: 37 mg/dL (ref 20–275)
Microalb, Ur: <0.2 mg/dL

## 2019-08-23 LAB — COMPLETE METABOLIC PANEL WITH GFR
AG Ratio: 1.6 (calc) (ref 1.0–2.5)
ALT: 31 U/L — ABNORMAL HIGH (ref 6–29)
AST: 28 U/L (ref 10–30)
Albumin: 4.3 g/dL (ref 3.6–5.1)
Alkaline phosphatase (APISO): 80 U/L (ref 31–125)
BUN: 7 mg/dL (ref 7–25)
CO2: 28 mmol/L (ref 20–32)
Calcium: 10.2 mg/dL (ref 8.6–10.2)
Chloride: 104 mmol/L (ref 98–110)
Creat: 0.91 mg/dL (ref 0.50–1.10)
GFR, Est African American: 97 mL/min/{1.73_m2} (ref >=60)
GFR, Est Non African American: 84 mL/min/{1.73_m2} (ref >=60)
Globulin: 2.7 g/dL (calc) (ref 1.9–3.7)
Glucose, Bld: 83 mg/dL (ref 65–99)
Potassium: 4.5 mmol/L (ref 3.5–5.3)
Sodium: 141 mmol/L (ref 135–146)
Total Bilirubin: 0.5 mg/dL (ref 0.2–1.2)
Total Protein: 7 g/dL (ref 6.1–8.1)

## 2019-08-23 LAB — VITAMIN D 25 HYDROXY (VIT D DEFICIENCY, FRACTURES): Vit D, 25-Hydroxy: 44 ng/mL (ref 30–100)

## 2019-10-24 ENCOUNTER — Encounter (INDEPENDENT_AMBULATORY_CARE_PROVIDER_SITE_OTHER): Payer: 59 | Admitting: Internal Medicine

## 2019-10-24 ENCOUNTER — Ambulatory Visit (INDEPENDENT_AMBULATORY_CARE_PROVIDER_SITE_OTHER): Payer: 59 | Admitting: Internal Medicine

## 2019-11-21 ENCOUNTER — Encounter (INDEPENDENT_AMBULATORY_CARE_PROVIDER_SITE_OTHER): Payer: Self-pay | Admitting: Internal Medicine

## 2019-11-21 ENCOUNTER — Other Ambulatory Visit: Payer: Self-pay

## 2019-11-21 ENCOUNTER — Ambulatory Visit (INDEPENDENT_AMBULATORY_CARE_PROVIDER_SITE_OTHER): Payer: 59 | Admitting: Internal Medicine

## 2019-11-21 VITALS — BP 124/76 | HR 80 | Temp 97.5°F | Resp 18 | Ht 68.0 in | Wt 214.0 lb

## 2019-11-21 DIAGNOSIS — I10 Essential (primary) hypertension: Secondary | ICD-10-CM

## 2019-11-21 DIAGNOSIS — E559 Vitamin D deficiency, unspecified: Secondary | ICD-10-CM | POA: Diagnosis not present

## 2019-11-21 DIAGNOSIS — E66811 Obesity, class 1: Secondary | ICD-10-CM

## 2019-11-21 DIAGNOSIS — N943 Premenstrual tension syndrome: Secondary | ICD-10-CM | POA: Diagnosis not present

## 2019-11-21 DIAGNOSIS — E669 Obesity, unspecified: Secondary | ICD-10-CM | POA: Diagnosis not present

## 2019-11-21 DIAGNOSIS — Z13220 Encounter for screening for lipoid disorders: Secondary | ICD-10-CM

## 2019-11-21 DIAGNOSIS — Z131 Encounter for screening for diabetes mellitus: Secondary | ICD-10-CM

## 2019-11-21 NOTE — Progress Notes (Signed)
Chief Complaint: This 34 year old lady comes in for an annual physical exam. HPI: She has been working fairly hard to try and lose weight.  She does intermittent fasting every day for 16 hours.  Then she tries to eat healthier.  She is also exercising on a regular basis. She continues to take vitamin D3 for vitamin D deficiency.  Past Medical History:  Diagnosis Date  . Abnormal Pap smear   . BV (bacterial vaginosis) 04/12/2014  . Contraceptive management 05/16/2013  . Eczema   . Essential hypertension, benign 06/06/2019  . Headache(784.0)    migraines with aura  . Obesity (BMI 30.0-34.9) 06/06/2019  . PMS (premenstrual syndrome) 06/06/2019  . Psoriasis   . Screening for STD (sexually transmitted disease) 05/16/2013  . Vaginal discharge 04/12/2014  . Vaginal Pap smear, abnormal   . Vitamin D deficiency disease 06/06/2019   Past Surgical History:  Procedure Laterality Date  . COLPOSCOPY W/ BIOPSY / CURETTAGE    . 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    Social History   Tobacco Use  . Smoking status: Never Smoker  . Smokeless tobacco: Never Used  . Tobacco comment: never used snuff or chewing tobacco.  Substance Use Topics  . Alcohol use: Yes    Comment: socially      Allergies:  Allergies  Allergen Reactions  . Ciprofloxacin Anaphylaxis, Hives and Itching  . Milk-Related Compounds Anaphylaxis  . Bactrim [Sulfamethoxazole-Trimethoprim] Hives    Itchy watery eyes     Current Meds  Medication Sig  . Cholecalciferol (VITAMIN D-3) 125 MCG (5000 UT) TABS Take 1 tablet by mouth daily.  . Multiple Vitamin (MULTIVITAMIN ADULT PO) Take 1 tablet by mouth daily.      Depression screen [facility] 2/9 02/22/2019 02/19/2018 11/17/2016 04/15/2016  Decreased Interest 0 0 0 0  Down, Depressed, Hopeless 0 0 0 0  PHQ - 2 Score 0 0 0 0     SHF:WYOVZ from the symptoms  mentioned above,there are no other symptoms referable to all systems reviewed.       Physical Exam: Blood pressure 124/76, pulse 80, temperature (!) 97.5 F (36.4 C), temperature source Temporal, resp. rate 18, height 5\' 8"  (1.727 m), weight 214 lb (97.1 kg), SpO2 98 %. Vitals with BMI 11/21/2019 08/22/2019 06/06/2019  Height 5\' 8"  5\' 8"  5' 7.75"  Weight 214 lbs 216 lbs 223 lbs 13 oz  BMI 32.55 85.88 50.27  Systolic 741 287 867  Diastolic 76 82 90  Pulse 80 82 80      She looks systemically well, remains obese but has lost another 2 pounds since March of this year.  Blood pressure is excellent. General: Alert, cooperative, and appears to be the stated age.No pallor.  No jaundice.  No clubbing. Head: Normocephalic Eyes: Sclera white, pupils equal and reactive to light, red reflex x 2,  Ears: Normal bilaterally Oral cavity: Lips, mucosa, and tongue normal: Teeth and gums normal Neck: No adenopathy, supple, symmetrical, trachea midline, and thyroid does not appear enlarged. Breast: Somewhat lumpy but no masses felt. Respiratory: Clear to auscultation bilaterally.No wheezing, crackles or bronchial breathing. Cardiovascular: Heart sounds are present and appear to be normal without murmurs or added sounds.  No carotid bruits.  Peripheral pulses are present and equal bilaterally.: Gastrointestinal:positive bowel sounds, no hepatosplenomegaly.  No masses felt.No tenderness. Skin: Clear, No rashes noted.No worrisome  skin lesions seen. Neurological: Grossly intact without focal findings, cranial nerves II through XII intact, muscle strength equal bilaterally Musculoskeletal: No acute joint abnormalities noted.Full range of movement noted with joints. Psychiatric: Affect appropriate, non-anxious.    Assessment  1. Essential hypertension, benign   2. Vitamin D deficiency disease   3. Obesity (BMI 30.0-34.9)   4. PMS (premenstrual syndrome)   5. Screening for diabetes mellitus   6.  Screening for lipoid disorders     Tests Ordered:   Orders Placed This Encounter  Procedures  . CBC  . COMPLETE METABOLIC PANEL WITH GFR  . Hemoglobin A1c  . Lipid panel  . T3, free  . T4  . TSH  . VITAMIN D 25 Hydroxy (Vit-D Deficiency, Fractures)     Plan  1. Blood work is ordered. 2. Her hypertension has been controlled with dietary measures and losing weight which is excellent. 3. She will continue to take vitamin D3 and we will check levels. 4. Further recommendations will depend on blood results and I will see her in 3 months time for follow-up.     No orders of the defined types were placed in this encounter.    Louanna Vanliew C Faustino Luecke   11/21/2019, 4:59 PM

## 2019-11-22 LAB — COMPLETE METABOLIC PANEL WITH GFR
AG Ratio: 1.8 (calc) (ref 1.0–2.5)
ALT: 16 U/L (ref 6–29)
AST: 17 U/L (ref 10–30)
Albumin: 4.2 g/dL (ref 3.6–5.1)
Alkaline phosphatase (APISO): 97 U/L (ref 31–125)
BUN: 11 mg/dL (ref 7–25)
CO2: 31 mmol/L (ref 20–32)
Calcium: 9.9 mg/dL (ref 8.6–10.2)
Chloride: 102 mmol/L (ref 98–110)
Creat: 0.96 mg/dL (ref 0.50–1.10)
GFR, Est African American: 91 mL/min/{1.73_m2} (ref >=60)
GFR, Est Non African American: 79 mL/min/{1.73_m2} (ref >=60)
Globulin: 2.4 g/dL (calc) (ref 1.9–3.7)
Glucose, Bld: 105 mg/dL — ABNORMAL HIGH (ref 65–99)
Potassium: 4.4 mmol/L (ref 3.5–5.3)
Sodium: 140 mmol/L (ref 135–146)
Total Bilirubin: 0.5 mg/dL (ref 0.2–1.2)
Total Protein: 6.6 g/dL (ref 6.1–8.1)

## 2019-11-22 LAB — CBC
HCT: 40.9 % (ref 35.0–45.0)
Hemoglobin: 13.8 g/dL (ref 11.7–15.5)
MCH: 30.7 pg (ref 27.0–33.0)
MCHC: 33.7 g/dL (ref 32.0–36.0)
MCV: 90.9 fL (ref 80.0–100.0)
MPV: 12.1 fL (ref 7.5–12.5)
Platelets: 235 10*3/uL (ref 140–400)
RBC: 4.5 10*6/uL (ref 3.80–5.10)
RDW: 13.4 % (ref 11.0–15.0)
WBC: 7.7 10*3/uL (ref 3.8–10.8)

## 2019-11-22 LAB — HEMOGLOBIN A1C
Hgb A1c MFr Bld: 5.1 % of total Hgb (ref <5.7)
Mean Plasma Glucose: 100 (calc)
eAG (mmol/L): 5.5 (calc)

## 2019-11-22 LAB — T3, FREE: T3, Free: 3.2 pg/mL (ref 2.3–4.2)

## 2019-11-22 LAB — LIPID PANEL
Cholesterol: 171 mg/dL (ref <200)
HDL: 60 mg/dL (ref >=50)
LDL Cholesterol (Calc): 94 mg/dL (calc)
Non-HDL Cholesterol (Calc): 111 mg/dL (calc) (ref <130)
Total CHOL/HDL Ratio: 2.9 (calc) (ref <5.0)
Triglycerides: 84 mg/dL (ref <150)

## 2019-11-22 LAB — VITAMIN D 25 HYDROXY (VIT D DEFICIENCY, FRACTURES): Vit D, 25-Hydroxy: 65 ng/mL (ref 30–100)

## 2019-11-22 LAB — TSH: TSH: 1.32 mIU/L

## 2019-11-22 LAB — T4: T4, Total: 6.5 ug/dL (ref 5.1–11.9)

## 2020-01-19 ENCOUNTER — Other Ambulatory Visit: Payer: Self-pay | Admitting: Obstetrics & Gynecology

## 2020-02-21 ENCOUNTER — Ambulatory Visit (INDEPENDENT_AMBULATORY_CARE_PROVIDER_SITE_OTHER): Payer: 59 | Admitting: Internal Medicine

## 2020-02-21 ENCOUNTER — Encounter (INDEPENDENT_AMBULATORY_CARE_PROVIDER_SITE_OTHER): Payer: Self-pay | Admitting: Internal Medicine

## 2020-02-21 ENCOUNTER — Other Ambulatory Visit: Payer: Self-pay

## 2020-02-21 VITALS — BP 129/100 | HR 100 | Temp 97.3°F | Resp 18 | Ht 68.0 in | Wt 209.6 lb

## 2020-02-21 DIAGNOSIS — I10 Essential (primary) hypertension: Secondary | ICD-10-CM

## 2020-02-21 DIAGNOSIS — N912 Amenorrhea, unspecified: Secondary | ICD-10-CM

## 2020-02-21 NOTE — Progress Notes (Signed)
Metrics: Intervention Frequency ACO  Documented Smoking Status Yearly  Screened one or more times in 24 months  Cessation Counseling or  Active cessation medication Past 24 months  Past 24 months   Guideline developer: UpToDate (See UpToDate for funding source) Date Released: 2014       Wellness Office Visit  Subjective:  Patient ID: Karina Clayton, female    DOB: Oct 24, 1985  Age: 34 y.o. MRN: [id number]  CC: Amenorrhea HPI  This lady comes in because she has not had a menstrual cycle since early July.  She did a home pregnancy test which was negative.  She is still concerned about the possibility of pregnancy and she tells me that she has been sexually active. Past Medical History:  Diagnosis Date  . Abnormal Pap smear   . BV (bacterial vaginosis) 04/12/2014  . Contraceptive management 05/16/2013  . Eczema   . Essential hypertension, benign 06/06/2019  . Headache(784.0)    migraines with aura  . Obesity (BMI 30.0-34.9) 06/06/2019  . PMS (premenstrual syndrome) 06/06/2019  . Psoriasis   . Screening for STD (sexually transmitted disease) 05/16/2013  . Vaginal discharge 04/12/2014  . Vaginal Pap smear, abnormal   . Vitamin D deficiency disease 06/06/2019   Past Surgical History:  Procedure Laterality Date  . COLPOSCOPY W/ BIOPSY / CURETTAGE    . NO PAST SURGERIES       Family History  Problem Relation Age of Onset  . Coronary artery disease Paternal Grandfather   . Coronary artery disease Paternal Grandmother   . Hypertension Maternal Grandmother   . Cancer Maternal Grandfather        stomach  . Bipolar disorder Sister   . Hypertension Sister   . Bipolar disorder Sister   . Cancer Maternal Uncle        prostate  . Multiple sclerosis Maternal Aunt   . Heart disease Father   . Hypertension Maternal Aunt   . 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   Social History   Tobacco Use  . Smoking status: Never Smoker  . Smokeless tobacco: Never Used  . Tobacco comment: never used snuff or chewing tobacco.  Substance Use Topics  . Alcohol use: Yes    Comment: socially    Current Meds  Medication Sig  . APRI 0.15-30 MG-MCG tablet TAKE 1 TABLET BY MOUTH  DAILY  . Cholecalciferol (VITAMIN D-3) 125 MCG (5000 UT) TABS Take 1 tablet by mouth daily.  . Multiple Vitamin (MULTIVITAMIN ADULT PO) Take 1 tablet by mouth daily.      Depression screen [facility] 2/9 02/22/2019 02/19/2018 11/17/2016 04/15/2016  Decreased Interest 0 0 0 0  Down, Depressed, Hopeless 0 0 0 0  PHQ - 2 Score 0 0 0 0     Objective:   Today's Vitals: BP (!) 129/100 (BP Location: Right Arm, Patient Position: Sitting, Cuff Size: Normal)   Pulse 100   Temp (!) 97.3 F (36.3 C) (Temporal)   Resp 18   Ht 5\' 8"  (1.727 m)   Wt 209 lb 9.6 oz (95.1 kg)   SpO2 98%   BMI 31.87 kg/m  Vitals with BMI 02/21/2020 11/21/2019 08/22/2019  Height 5\' 8"  5\' 8"  5\' 8"   Weight 209 lbs 10 oz 214 lbs 216 lbs  BMI 31.88 32.55 32.85  Systolic 129 124 08/24/2019  Diastolic 100 76 82  Pulse 100 80 82     Physical Exam  She looks systemically well.  Diastolic blood pressure elevated today but she is anxious.     Assessment   1. Essential hypertension, benign   2. Amenorrhea       Tests ordered Orders Placed This Encounter  Procedures  . HCG, Quant, Pregnancy     Plan: 1. We will do a quantitative hCG pregnancy test today and I will let her know as soon as possible. 2. We also briefly discussed COVID-19 vaccination and she tells me she will be getting COVID-19 vaccination soon. 3. I will see her for follow-up next year.   No orders of the defined types were placed in this encounter.   Wilson Singer, MD

## 2020-02-22 LAB — HCG, QUANTITATIVE, PREGNANCY: HCG, Total, QN: 3 m[IU]/mL

## 2020-02-23 ENCOUNTER — Ambulatory Visit (INDEPENDENT_AMBULATORY_CARE_PROVIDER_SITE_OTHER): Payer: 59 | Admitting: Obstetrics & Gynecology

## 2020-02-23 ENCOUNTER — Other Ambulatory Visit: Payer: Self-pay

## 2020-02-23 ENCOUNTER — Encounter: Payer: Self-pay | Admitting: Obstetrics & Gynecology

## 2020-02-23 VITALS — BP 118/80 | HR 81 | Ht 68.0 in | Wt 211.0 lb

## 2020-02-23 DIAGNOSIS — N926 Irregular menstruation, unspecified: Secondary | ICD-10-CM | POA: Diagnosis not present

## 2020-02-23 LAB — POCT URINE PREGNANCY: Preg Test, Ur: NEGATIVE

## 2020-02-23 MED ORDER — MEDROXYPROGESTERONE ACETATE 10 MG PO TABS: 10.0000 mg | ORAL_TABLET | Freq: Every day | ORAL | 0 refills | Status: DC

## 2020-02-23 NOTE — Progress Notes (Signed)
Chief Complaint  Patient presents with  . Amenorrhea    lmp was in July.       34 y.o. G1P1001 No LMP recorded. (Menstrual status: Other). The current method of family planning is none.  Outpatient Encounter Medications as of 02/23/2020  Medication Sig  . medroxyPROGESTERone (PROVERA) 10 MG tablet Take 1 tablet (10 mg total) by mouth daily.  . [DISCONTINUED] APRI 0.15-30 MG-MCG tablet TAKE 1 TABLET BY MOUTH  DAILY  . [DISCONTINUED] Cholecalciferol (VITAMIN D-3) 125 MCG (5000 UT) TABS Take 1 tablet by mouth daily.  . [DISCONTINUED] Multiple Vitamin (MULTIVITAMIN ADULT PO) Take 1 tablet by mouth daily.   No facility-administered encounter medications on file as of 02/23/2020.    Subjective Pt missed her period since July, only bled only 2 days then No med cahnges not on any BCM Negative pregnancy test at home nd here Having cramps but no bleeding COVID in April, mild case  Past Medical History:  Diagnosis Date  . Abnormal Pap smear   . BV (bacterial vaginosis) 04/12/2014  . Contraceptive management 05/16/2013  . Eczema   . Essential hypertension, benign 06/06/2019  . Headache(784.0)    migraines with aura  . Obesity (BMI 30.0-34.9) 06/06/2019  . PMS (premenstrual syndrome) 06/06/2019  . Psoriasis   . Screening for STD (sexually transmitted disease) 05/16/2013  . Vaginal discharge 04/12/2014  . Vaginal Pap smear, abnormal   . Vitamin D deficiency disease 06/06/2019    Past Surgical History:  Procedure Laterality Date  . COLPOSCOPY W/ BIOPSY / CURETTAGE    . NO PAST SURGERIES      OB History    Gravida  1   Para  1   Term  1   Preterm      AB      Living  1     SAB      TAB      Ectopic      Multiple      Live Births  1           Allergies  Allergen Reactions  . Ciprofloxacin Anaphylaxis, Hives and Itching  . Milk-Related Compounds Anaphylaxis  . Bactrim [Sulfamethoxazole-Trimethoprim] Hives    Itchy watery eyes    Social History    Socioeconomic History  . Marital status: Single    Spouse name: Not on file  . Number of children: Not on file  . Years of education: Not on file  . Highest education level: Not on file  Occupational History  . Not on file  Tobacco Use  . Smoking status: Never Smoker  . Smokeless tobacco: Never Used  . Tobacco comment: never used snuff or chewing tobacco.  Vaping Use  . Vaping Use: Never used  Substance and Sexual Activity  . Alcohol use: Yes    Comment: socially  . Drug use: No  . Sexual activity: Yes    Birth control/protection: Condom, Pill  Other Topics Concern  . 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   Social Determinants of Health   Financial Resource Strain:   . Difficulty of Paying Living Expenses: Not on file  Food Insecurity:   . Worried About Programme researcher, broadcasting/film/video in the Last Year: Not on file  . Ran Out of Food in the Last Year: Not on file  Transportation Needs:   . Lack of Transportation (Medical): Not on file  .  Lack of Transportation (Non-Medical): Not on file  Physical Activity:   . Days of Exercise per Week: Not on file  . Minutes of Exercise per Session: Not on file  Stress:   . Feeling of Stress : Not on file  Social Connections:   . Frequency of Communication with Friends and Family: Not on file  . Frequency of Social Gatherings with Friends and Family: Not on file  . Attends Religious Services: Not on file  . Active Member of Clubs or Organizations: Not on file  . Attends Banker Meetings: Not on file  . Marital Status: Not on file    Family History  Problem Relation Age of Onset  . Coronary artery disease Paternal Grandfather   . Coronary artery disease Paternal Grandmother   . Hypertension Maternal Grandmother   . Cancer Maternal Grandfather        stomach  . Bipolar disorder Sister   . Hypertension Sister   . Bipolar disorder Sister   . Cancer  Maternal Uncle        prostate  . Multiple sclerosis Maternal Aunt   . Heart disease Father   . Hypertension Maternal Aunt   . Diabetes Other        paternal great uncle    Medications:       Current Outpatient Medications:  .  medroxyPROGESTERone (PROVERA) 10 MG tablet, Take 1 tablet (10 mg total) by mouth daily., Disp: 10 tablet, Rfl: 0  Objective Blood pressure 118/80, pulse 81, height 5\' 8"  (1.727 m), weight 211 lb (95.7 kg).  Gen WDWN NAD  Pertinent ROS No burning with urination, frequency or urgency No nausea, vomiting or diarrhea Nor fever chills or other constitutional symptoms   Labs or studies Negative pregnancy test    Impression Diagnoses this Encounter::   ICD-10-CM   1. Missed period  N92.6 POCT urine pregnancy    Established relevant diagnosis(es):   Plan/Recommendations: Meds ordered this encounter  Medications  . medroxyPROGESTERone (PROVERA) 10 MG tablet    Sig: Take 1 tablet (10 mg total) by mouth daily.    Dispense:  10 tablet    Refill:  0    Labs or Scans Ordered: Orders Placed This Encounter  Procedures  . POCT urine pregnancy    Management:: Progesterone challenge  Follow up No follow-ups on file.        Face to face time:  10 minutes  Greater than 50% of the visit time was spent in counseling and coordination of care with the patient.  The summary and outline of the counseling and care coordination is summarized in the note above.   All questions were answered.

## 2020-04-19 ENCOUNTER — Other Ambulatory Visit: Payer: Self-pay

## 2020-04-19 ENCOUNTER — Other Ambulatory Visit (INDEPENDENT_AMBULATORY_CARE_PROVIDER_SITE_OTHER): Payer: 59 | Admitting: *Deleted

## 2020-04-19 DIAGNOSIS — Z113 Encounter for screening for infections with a predominantly sexual mode of transmission: Secondary | ICD-10-CM

## 2020-04-19 DIAGNOSIS — R300 Dysuria: Secondary | ICD-10-CM

## 2020-04-19 LAB — POCT URINALYSIS DIPSTICK OB
Glucose, UA: NEGATIVE
Nitrite, UA: NEGATIVE
POC,PROTEIN,UA: NEGATIVE

## 2020-04-19 NOTE — Progress Notes (Addendum)
   NURSE VISIT- UTI SYMPTOMS   SUBJECTIVE:  Karina Clayton is a 33 y.o. G17P1001 female here for UTI symptoms. She is a GYN patient. She reports urinary urgency.  OBJECTIVE:  There were no vitals taken for this visit.  Appears well, in no apparent distress  No results found for this or any previous visit (from the past 24 hour(s)).  ASSESSMENT: GYN patient with UTI symptoms and negative nitrites. Patient also requested urine std screen. Send for Trich, Gonorrhea and Chlamydia.   PLAN: Note routed to Dr. Despina Hidden   Rx sent by provider today: No Urine culture sent Call or return to clinic prn if these symptoms worsen or fail to improve as anticipated. Follow-up: as needed   Karina Clayton  04/19/2020 2:31 PM  Will await culture report prior to prescribing a med with negative nitrites  Attestation of Attending Supervision of Nursing Visit Encounter: Evaluation and management procedures were performed by the nursing staff under my supervision and collaboration.  I have reviewed the nurse's note and chart, and I agree with the management and plan.  Rockne Coons MD Attending Physician for the [facility] 04/20/2020 7:24 AM

## 2020-04-22 LAB — URINE CULTURE

## 2020-04-22 LAB — TRICHOMONAS VAGINALIS, PROBE AMP: Trich vag by NAA: NEGATIVE

## 2020-04-23 LAB — GC/CHLAMYDIA PROBE AMP
Chlamydia trachomatis, NAA: NEGATIVE
Neisseria Gonorrhoeae by PCR: NEGATIVE

## 2020-07-02 ENCOUNTER — Other Ambulatory Visit: Payer: Self-pay

## 2020-07-02 ENCOUNTER — Ambulatory Visit (INDEPENDENT_AMBULATORY_CARE_PROVIDER_SITE_OTHER): Payer: 59 | Admitting: Internal Medicine

## 2020-07-02 ENCOUNTER — Encounter (INDEPENDENT_AMBULATORY_CARE_PROVIDER_SITE_OTHER): Payer: Self-pay | Admitting: Internal Medicine

## 2020-07-02 VITALS — BP 122/76 | HR 102 | Temp 97.8°F | Ht 68.0 in | Wt 205.4 lb

## 2020-07-02 DIAGNOSIS — E669 Obesity, unspecified: Secondary | ICD-10-CM | POA: Diagnosis not present

## 2020-07-02 DIAGNOSIS — R599 Enlarged lymph nodes, unspecified: Secondary | ICD-10-CM | POA: Diagnosis not present

## 2020-07-02 DIAGNOSIS — E559 Vitamin D deficiency, unspecified: Secondary | ICD-10-CM

## 2020-07-02 NOTE — Progress Notes (Signed)
Metrics: Intervention Frequency ACO  Documented Smoking Status Yearly  Screened one or more times in 24 months  Cessation Counseling or  Active cessation medication Past 24 months  Past 24 months   Guideline developer: UpToDate (See UpToDate for funding source) Date Released: 2014       Wellness Office Visit  Subjective:  Patient ID: Karina Clayton, female    DOB: 1985/11/19  Age: 35 y.o. MRN: [id number]  CC: This lady comes in for follow-up of vitamin D deficiency and obesity. HPI  She is describing possible lymph node enlargement in her neck and also a rash. Otherwise she feels well and she continues to take vitamin D3 for vitamin D deficiency. Past Medical History:  Diagnosis Date  . Abnormal Pap smear   . BV (bacterial vaginosis) 04/12/2014  . Contraceptive management 05/16/2013  . Eczema   . Essential hypertension, benign 06/06/2019  . Headache(784.0)    migraines with aura  . Obesity (BMI 30.0-34.9) 06/06/2019  . PMS (premenstrual syndrome) 06/06/2019  . Psoriasis   . Screening for STD (sexually transmitted disease) 05/16/2013  . Vaginal discharge 04/12/2014  . Vaginal Pap smear, abnormal   . Vitamin D deficiency disease 06/06/2019   Past Surgical History:  Procedure Laterality Date  . COLPOSCOPY W/ BIOPSY / CURETTAGE    . NO PAST SURGERIES       Family History  Problem Relation Age of Onset  . Coronary artery disease Paternal Grandfather   . Coronary artery disease Paternal Grandmother   . Hypertension Maternal Grandmother   . Cancer Maternal Grandfather        stomach  . Bipolar disorder Sister   . Hypertension Sister   . Bipolar disorder Sister   . Cancer Maternal Uncle        prostate  . Multiple sclerosis Maternal Aunt   . Heart disease Father   . Hypertension Maternal Aunt   . 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    Social History   Tobacco Use  . Smoking status: Never Smoker  . Smokeless tobacco: Never Used  . Tobacco comment: never used snuff or chewing tobacco.  Substance Use Topics  . Alcohol use: Yes    Comment: socially    No outpatient medications have been marked as taking for the 07/02/20 encounter (Office Visit) with Wilson Singer, MD.      Depression screen [facility] 2/9 07/02/2020 02/22/2019 02/19/2018 11/17/2016 04/15/2016  Decreased Interest 0 0 0 0 0  Down, Depressed, Hopeless 0 0 0 0 0  PHQ - 2 Score 0 0 0 0 0  Altered sleeping 0 - - - -  Tired, decreased energy 0 - - - -  Change in appetite 0 - - - -  Feeling bad or failure about yourself  0 - - - -  Trouble concentrating 0 - - - -  Moving slowly or fidgety/restless 0 - - - -  Suicidal thoughts 0 - - - -  PHQ-9 Score 0 - - - -  Difficult doing work/chores Not difficult at all - - - -     Objective:   Today's Vitals: BP 122/76   Pulse (!) 102   Temp 97.8 F (36.6 C) (Temporal)   Ht 5\' 8"  (1.727 m)   Wt 205 lb 6.4 oz (93.2 kg)   SpO2  93%   BMI 31.23 kg/m  Vitals with BMI 07/02/2020 02/23/2020 02/21/2020  Height 5\' 8"  5\' 8"  5\' 8"   Weight 205 lbs 6 oz 211 lbs 209 lbs 10 oz  BMI 31.24 32.09 31.88  Systolic 122 118  Diastolic 76 80 100  Pulse 102 81 100     Physical Exam  She looks systemically well, remains obese but has lost 6 pounds since the last visit.  Blood pressure is acceptable.  Examination of her neck shows a possible left-sided lymph node enlargement in the posterior cervical chain.  Alternatively, this could be simply a sebaceous cyst.  The rash in question appears to be flaky skin and I am not sure what this represents exactly.     Assessment   1. Obesity (BMI 30.0-34.9)   2. Vitamin D deficiency disease   3. Lymph node enlargement       Tests ordered Orders Placed This Encounter  Procedures  . CBC  . Sedimentation rate     Plan: 1. I will check a CBC and a sed rate to make sure  there are no other worrisome disease process with the possibility of lymphadenopathy going on.  She will monitor it. 2. As far as the rash is concerned, I suggested hydrocortisone over-the-counter for right now and if it does not improve, she will let know. 3. Continue with vitamin D3 supplementation for vitamin D deficiency. 4. Follow-up with 248 in 6 months for an annual physical exam.  I did stress to her again the importance of COVID-19 vaccine.   No orders of the defined types were placed in this encounter.   10-11-1992, MD

## 2020-07-03 LAB — CBC
HCT: 43.1 % (ref 35.0–45.0)
Hemoglobin: 14.6 g/dL (ref 11.7–15.5)
MCH: 31 pg (ref 27.0–33.0)
MCHC: 33.9 g/dL (ref 32.0–36.0)
MCV: 91.5 fL (ref 80.0–100.0)
MPV: 12.2 fL (ref 7.5–12.5)
Platelets: 239 10*3/uL (ref 140–400)
RBC: 4.71 10*6/uL (ref 3.80–5.10)
RDW: 12.6 % (ref 11.0–15.0)
WBC: 4 10*3/uL (ref 3.8–10.8)

## 2020-07-03 LAB — SEDIMENTATION RATE: Sed Rate: 2 mm/h (ref 0–20)

## 2020-07-23 ENCOUNTER — Other Ambulatory Visit (INDEPENDENT_AMBULATORY_CARE_PROVIDER_SITE_OTHER): Payer: 59

## 2020-07-23 ENCOUNTER — Other Ambulatory Visit: Payer: Self-pay

## 2020-07-23 ENCOUNTER — Other Ambulatory Visit (HOSPITAL_COMMUNITY)
Admission: RE | Admit: 2020-07-23 | Discharge: 2020-07-23 | Disposition: A | Discharge status: Home / Self Care | Payer: 59 | Source: Ambulatory Visit | Attending: Obstetrics & Gynecology | Admitting: Obstetrics & Gynecology

## 2020-07-23 DIAGNOSIS — N898 Other specified noninflammatory disorders of vagina: Secondary | ICD-10-CM | POA: Insufficient documentation

## 2020-07-23 DIAGNOSIS — Z3041 Encounter for surveillance of contraceptive pills: Secondary | ICD-10-CM | POA: Diagnosis not present

## 2020-07-23 LAB — POCT URINE PREGNANCY: Preg Test, Ur: NEGATIVE

## 2020-07-23 NOTE — Progress Notes (Addendum)
   NURSE VISIT- VAGINITIS/STD/POC  SUBJECTIVE:  Karina Clayton is a 35 y.o. G1P1001 GYN patientfemale here for a vaginal swab for STD screen.  She reports the following symptoms: none for 0 day. Denies abnormal vaginal bleeding, significant pelvic pain, fever, or UTI symptoms.  OBJECTIVE:  LMP 06/28/2020 (Exact Date)   Appears well, in no apparent distress  ASSESSMENT: Vaginal swab for STD screen  PLAN: Self-collected vaginal probe for Gonorrhea, Chlamydia, Trichomonas, Bacterial Vaginosis, Yeast sent to lab Treatment: to be determined once results are received Follow-up as needed if symptoms persist/worsen, or new symptoms develop  Shenaya Lebo A Derrika Ruffalo  07/23/2020 10:44 AM   Chart reviewed for nurse visit. Agree with plan of care.  Cheral Marker, [place] 07/23/2020 12:54 PM    Pt requested pregnancy test.    NURSE VISIT- PREGNANCY CONFIRMATION   SUBJECTIVE:  Karina Clayton is a 35 y.o. G68P1001 female at Unknown by certain LMP of Patient's last menstrual period was 06/28/2020 (exact date). Here for pregnancy confirmation.  Home pregnancy test: not taken  She reports no complaints.  She is not taking prenatal vitamins.    OBJECTIVE:  LMP 06/28/2020 (Exact Date)   Appears well, in no apparent distress OB History  Gravida Para Term Preterm AB Living  1 1 1     1   SAB IAB Ectopic Multiple Live Births          1    # Outcome Date GA Lbr Len/2nd Weight Sex Delivery Anes PTL Lv  1 Term 09/08/12 [redacted]w[redacted]d 11:08 / 05:31 7 lb 2.6 oz (3.25 kg) M Vag-Spont EPI  LIV    Results for orders placed or performed in visit on 07/23/20 (from the past 24 hour(s))  POCT urine pregnancy   Collection Time: 07/23/20 10:01 AM  Result Value Ref Range   Preg Test, Ur Negative Negative    ASSESSMENT: Negative pregnancy test    PLAN: OB packet given: No  Educated pt to use back-up method, like condom, when [facility] pill missed.  Angeleena Dueitt A Dina Mobley  07/23/2020 10:44 AM   Chart reviewed for nurse visit.  Agree with plan of care.  07/25/2020, Cheral Marker 07/23/2020 12:55 PM

## 2020-07-24 ENCOUNTER — Other Ambulatory Visit: Payer: Self-pay | Admitting: Women's Health

## 2020-07-24 LAB — CERVICOVAGINAL ANCILLARY ONLY
Bacterial Vaginitis (gardnerella): POSITIVE — AB
Candida Glabrata: NEGATIVE
Candida Vaginitis: POSITIVE — AB
Chlamydia: NEGATIVE
Comment: NEGATIVE
Comment: NEGATIVE
Comment: NEGATIVE
Comment: NEGATIVE
Comment: NEGATIVE
Comment: NORMAL
Neisseria Gonorrhea: NEGATIVE
Trichomonas: NEGATIVE

## 2020-07-24 MED ORDER — FLUCONAZOLE 150 MG PO TABS: 150.0000 mg | ORAL_TABLET | Freq: Once | ORAL | 0 refills | Status: AC

## 2020-07-24 MED ORDER — METRONIDAZOLE 500 MG PO TABS: 500.0000 mg | ORAL_TABLET | Freq: Two times a day (BID) | ORAL | 0 refills | Status: DC

## 2020-11-30 ENCOUNTER — Other Ambulatory Visit: Payer: Self-pay | Admitting: Obstetrics & Gynecology

## 2021-01-01 ENCOUNTER — Encounter (INDEPENDENT_AMBULATORY_CARE_PROVIDER_SITE_OTHER): Payer: 59 | Admitting: Internal Medicine

## 2021-01-21 ENCOUNTER — Encounter (INDEPENDENT_AMBULATORY_CARE_PROVIDER_SITE_OTHER): Payer: 59 | Admitting: Internal Medicine

## 2021-03-19 ENCOUNTER — Encounter: Payer: Self-pay | Admitting: Women's Health

## 2021-03-19 ENCOUNTER — Other Ambulatory Visit (HOSPITAL_COMMUNITY)
Admission: RE | Admit: 2021-03-19 | Discharge: 2021-03-19 | Disposition: A | Discharge status: Home / Self Care | Payer: 59 | Source: Ambulatory Visit | Attending: Women's Health | Admitting: Women's Health

## 2021-03-19 ENCOUNTER — Ambulatory Visit (INDEPENDENT_AMBULATORY_CARE_PROVIDER_SITE_OTHER): Payer: 59 | Admitting: Women's Health

## 2021-03-19 ENCOUNTER — Other Ambulatory Visit: Payer: Self-pay

## 2021-03-19 VITALS — BP 131/76 | HR 106 | Ht 68.0 in | Wt 211.0 lb

## 2021-03-19 DIAGNOSIS — Z01419 Encounter for gynecological examination (general) (routine) without abnormal findings: Secondary | ICD-10-CM

## 2021-03-19 DIAGNOSIS — Z30015 Encounter for initial prescription of vaginal ring hormonal contraceptive: Secondary | ICD-10-CM | POA: Diagnosis not present

## 2021-03-19 DIAGNOSIS — Z113 Encounter for screening for infections with a predominantly sexual mode of transmission: Secondary | ICD-10-CM | POA: Insufficient documentation

## 2021-03-19 MED ORDER — ANNOVERA 0.013-0.15 MG/24HR VA RING: VAGINAL_RING | VAGINAL | 0 refills | Status: DC

## 2021-03-19 NOTE — Progress Notes (Signed)
WELL-WOMAN EXAMINATION Patient name: Karina Clayton MRN [id number]  Date of birth: [birth year] Chief Complaint:   Gynecologic Exam  History of Present Illness:   Karina Clayton is a 35 y.o. G50P1001 African-American female being seen today for a routine well-woman exam.  Current complaints: wants birth control. Discussed options, wants Annovera and sample of Phexxi. Does not smoke, no h/o HTN, DVT/PE, CVA, MI, or migraines w/ aura.   Took planB 10/2 (within 24hr of unprotected sex), having some brown spotting.  Rt nipple piercing (has had since March), is trying to 'reject', put apple cider vinegar on it last night.   PCP: looking (was [place])      Desires STD screen, no other labs Patient's last menstrual period was 02/25/2021 (exact date). The current method of family planning is none.  Last pap 02/19/18. Results were: NILM w/ HRHPV negative. H/O abnormal pap: yes   Last mammogram: never. Results were: N/A. Family h/o breast cancer: yes MA dx last year @ 35yo Last colonoscopy: never. Results were: N/A. Family h/o colorectal cancer: no  Depression screen [facility] 2/9 03/19/2021 07/02/2020 02/22/2019 02/19/2018 11/17/2016  Decreased Interest 0 0 0 0 0  Down, Depressed, Hopeless 0 0 0 0 0  PHQ - 2 Score 0 0 0 0 0  Altered sleeping 0 0 - - -  Tired, decreased energy 0 0 - - -  Change in appetite 0 0 - - -  Feeling bad or failure about yourself  0 0 - - -  Trouble concentrating 0 0 - - -  Moving slowly or fidgety/restless 0 0 - - -  Suicidal thoughts 0 0 - - -  PHQ-9 Score 0 0 - - -  Difficult doing work/chores - Not difficult at all - - -     GAD 7 : Generalized Anxiety Score 03/19/2021  Nervous, Anxious, on Edge 0  Control/stop worrying 0  Worry too much - different things 0  Trouble relaxing 0  Restless 0  Easily annoyed or irritable 0  Afraid - awful might happen 0  Total GAD 7 Score 0     Review of Systems:   Pertinent items are noted in HPI Denies any headaches, blurred  vision, fatigue, shortness of breath, chest pain, abdominal pain, abnormal vaginal discharge/itching/odor/irritation, problems with periods, bowel movements, urination, or intercourse unless otherwise stated above. Pertinent History Reviewed:  Reviewed past medical,surgical, social and family history.  Reviewed problem list, medications and allergies. Physical Assessment:   Vitals:   03/19/21 0844  BP: 131/76  Pulse: (!) 106  Weight: 211 lb (95.7 kg)  Height: 5\' 8"  (1.727 m)  Body mass index is 32.08 kg/m.        Physical Examination:   General appearance - well appearing, and in no distress  Mental status - alert, oriented to person, place, and time  Psych:  She has a normal mood and affect  Skin - warm and dry, normal color, no suspicious lesions noted  Chest - effort normal, all lung fields clear to auscultation bilaterally  Heart - normal rate and regular rhythm  Neck:  midline trachea, no thyromegaly or nodules  Breasts - breasts appear normal, no suspicious masses, no skin or nipple changes or axillary nodes. Couple of scabs Rt nipple around piercing, no s/s infection.  Abdomen - soft, nontender, nondistended, no masses or organomegaly  Pelvic - VULVA: normal appearing vulva with no masses, tenderness or lesions  VAGINA: normal appearing vagina with normal color and discharge,  no lesions  CERVIX: normal appearing cervix without discharge or lesions, no CMT  Thin prep pap is done w/ HR HPV cotesting  UTERUS: uterus is felt to be normal size, shape, consistency and nontender   ADNEXA: No adnexal masses or tenderness noted.  Extremities:  No swelling or varicosities noted  Chaperone: Faith Rogue    No results found for this or any previous visit (from the past 24 hour(s)).  Assessment & Plan:  1) Well-Woman Exam  2) Contraception management> 1 box Phexxi samples given; rx Annovera to [facility], f/u  3) STD screen  Labs/procedures today: pap, STD screen  Mammogram:  @ 35yo, or sooner if problems Colonoscopy: @ 35yo, or sooner if problems  Orders Placed This Encounter  Procedures   HIV Antibody (routine testing w rflx)   RPR    Meds:  Meds ordered this encounter  Medications   Segesterone-Ethinyl Estradiol (ANNOVERA) 0.15-0.013 MG/24HR RING    Sig: Insert vaginally x 3 weeks, remove for 1 week; repeat monthly x 84yr    Dispense:  1 each    Refill:  0    Order Specific Question:   Supervising Provider    Answer:   Lazaro Arms [[id number]]     Follow-up: Return in about 3 months (around 06/19/2021) for med f/u, CNM, in person.  Cheral Marker CNM, [facility] 03/19/2021 10:38 AM

## 2021-03-19 NOTE — Patient Instructions (Signed)
Primary Care Providers Dr. Zack Hall ([place]) [phone] [facility] [phone] [facility] ([place]) [phone] [facility] ([place]) [phone] [facility] ([place]) [phone] [facility] [place] [phone] [facility] [phone] 

## 2021-03-20 LAB — CYTOLOGY - PAP
Chlamydia: NEGATIVE
Comment: NEGATIVE
Comment: NEGATIVE
Comment: NORMAL
Diagnosis: NEGATIVE
High risk HPV: NEGATIVE
Neisseria Gonorrhea: NEGATIVE

## 2021-03-27 ENCOUNTER — Other Ambulatory Visit (INDEPENDENT_AMBULATORY_CARE_PROVIDER_SITE_OTHER): Payer: 59 | Admitting: *Deleted

## 2021-03-27 ENCOUNTER — Telehealth: Payer: Self-pay | Admitting: Advanced Practice Midwife

## 2021-03-27 ENCOUNTER — Other Ambulatory Visit: Payer: Self-pay

## 2021-03-27 DIAGNOSIS — R3915 Urgency of urination: Secondary | ICD-10-CM | POA: Diagnosis not present

## 2021-03-27 DIAGNOSIS — R102 Pelvic and perineal pain: Secondary | ICD-10-CM

## 2021-03-27 MED ORDER — PHENAZOPYRIDINE HCL 200 MG PO TABS: 200.0000 mg | ORAL_TABLET | Freq: Three times a day (TID) | ORAL | 0 refills | Status: DC | PRN

## 2021-03-27 MED ORDER — NITROFURANTOIN MONOHYD MACRO 100 MG PO CAPS: 100.0000 mg | ORAL_CAPSULE | Freq: Two times a day (BID) | ORAL | 0 refills | Status: DC

## 2021-03-27 NOTE — Progress Notes (Signed)
   NURSE VISIT- UTI SYMPTOMS   SUBJECTIVE:  Karina Clayton is a 35 y.o. G75P1001 female here for UTI symptoms. She is a GYN patient. She reports lower abdominal pain and urinary urgency.  OBJECTIVE:  LMP 02/25/2021 (Exact Date)   Appears well, in no apparent distress  No results found for this or any previous visit (from the past 24 hour(s)).  ASSESSMENT: GYN patient with UTI symptoms and unable to dip due to AZO ? nitrites  PLAN: Note Routed to Cyril Mourning, AGNP   Rx sent by provider today: No Urine culture sent Call or return to clinic prn if these symptoms worsen or fail to improve as anticipated. Follow-up: as needed   Annamarie Dawley  03/27/2021 3:23 PM

## 2021-03-27 NOTE — Progress Notes (Signed)
Chart reviewed for nurse visit. Agree with plan of care. Rx macrobid and pyridium  Adline Potter, NP 03/27/2021 4:57 PM

## 2021-03-27 NOTE — Addendum Note (Signed)
Addended by: Cyril Mourning A on: 03/27/2021 04:58 PM   Modules accepted: Orders

## 2021-03-27 NOTE — Telephone Encounter (Signed)
Patient states she is having urinary urgency but is not getting much out when she goes.  Also having pressure and slight discomfort.  She is taking AZO and is feeling better but wants to be treated anyway.  Requested patient come in this afternoon for a urine dip and culture with the nurse.  Pt agreeable to plan and will come this afternoon.

## 2021-03-27 NOTE — Telephone Encounter (Signed)
Patient calling urgency to pee nothing comes out and pressure no discharge and a little odor wanting to know if anything can be sent to pharmacy [facility] on [address]

## 2021-03-29 ENCOUNTER — Other Ambulatory Visit: Payer: Self-pay | Admitting: Adult Health

## 2021-03-29 MED ORDER — ETONOGESTREL-ETHINYL ESTRADIOL 0.12-0.015 MG/24HR VA RING
1.0000 | VAGINAL_RING | VAGINAL | 11 refills | Status: DC
Start: 2021-03-29 — End: 2021-06-19

## 2021-03-29 NOTE — Progress Notes (Signed)
Will rx nuva ring

## 2021-04-02 LAB — URINE CULTURE

## 2021-05-14 ENCOUNTER — Ambulatory Visit: Payer: Self-pay

## 2021-05-15 ENCOUNTER — Other Ambulatory Visit (INDEPENDENT_AMBULATORY_CARE_PROVIDER_SITE_OTHER): Payer: 59

## 2021-05-15 ENCOUNTER — Other Ambulatory Visit: Payer: Self-pay

## 2021-05-15 ENCOUNTER — Other Ambulatory Visit (HOSPITAL_COMMUNITY)
Admission: RE | Admit: 2021-05-15 | Discharge: 2021-05-15 | Disposition: A | Discharge status: Home / Self Care | Payer: 59 | Source: Ambulatory Visit | Attending: Obstetrics & Gynecology | Admitting: Obstetrics & Gynecology

## 2021-05-15 DIAGNOSIS — Z113 Encounter for screening for infections with a predominantly sexual mode of transmission: Secondary | ICD-10-CM | POA: Insufficient documentation

## 2021-05-15 NOTE — Progress Notes (Addendum)
   NURSE VISIT- VAGINITIS/STD  SUBJECTIVE:  Karina Clayton is a 35 y.o. G1P1001 GYN patientfemale here for a vaginal swab for vaginitis screening, STD screen.  She reports the following symptoms: none. Denies abnormal vaginal bleeding, significant pelvic pain, fever, or UTI symptoms.  OBJECTIVE:  There were no vitals taken for this visit.  Appears well, in no apparent distress  ASSESSMENT: Vaginal swab for  vaginitis & STD screening Lab orders for HSV2 IGG, HIV, RPR, Hep B  PLAN: Self-collected vaginal probe for Gonorrhea, Chlamydia, Trichomonas, Bacterial Vaginosis, Yeast sent to lab Treatment: to be determined once results are received Follow-up as needed if symptoms persist/worsen, or new symptoms develop  Job Holtsclaw A Fatmata Legere  05/15/2021 3:30 PM   Chart reviewed for nurse visit. Agree with plan of care. Per RN, pt wants ALL STD screening, exposed to HSV. Instructed nurse to tell pt serum HSV not very accurate Cheral Marker, CNM 05/15/2021 3:56 PM

## 2021-05-16 ENCOUNTER — Encounter: Payer: Self-pay | Admitting: Women's Health

## 2021-05-16 DIAGNOSIS — R768 Other specified abnormal immunological findings in serum: Secondary | ICD-10-CM | POA: Insufficient documentation

## 2021-05-16 DIAGNOSIS — R7689 Other specified abnormal immunological findings in serum: Secondary | ICD-10-CM | POA: Insufficient documentation

## 2021-05-16 LAB — HSV 2 ANTIBODY, IGG: HSV 2 IgG, Type Spec: 15.3 index — ABNORMAL HIGH (ref 0.00–0.90)

## 2021-05-16 LAB — HEPATITIS B SURFACE ANTIGEN: Hepatitis B Surface Ag: NEGATIVE

## 2021-05-16 LAB — RPR: RPR Ser Ql: NONREACTIVE

## 2021-05-16 LAB — HIV ANTIBODY (ROUTINE TESTING W REFLEX): HIV Screen 4th Generation wRfx: NONREACTIVE

## 2021-05-17 LAB — CERVICOVAGINAL ANCILLARY ONLY
Bacterial Vaginitis (gardnerella): NEGATIVE
Candida Glabrata: NEGATIVE
Candida Vaginitis: NEGATIVE
Chlamydia: NEGATIVE
Comment: NEGATIVE
Comment: NEGATIVE
Comment: NEGATIVE
Comment: NEGATIVE
Comment: NEGATIVE
Comment: NORMAL
Neisseria Gonorrhea: NEGATIVE
Trichomonas: NEGATIVE

## 2021-06-19 ENCOUNTER — Encounter: Payer: Self-pay | Admitting: Adult Health

## 2021-06-19 ENCOUNTER — Other Ambulatory Visit: Payer: Self-pay

## 2021-06-19 ENCOUNTER — Ambulatory Visit (INDEPENDENT_AMBULATORY_CARE_PROVIDER_SITE_OTHER): Payer: 59 | Admitting: Adult Health

## 2021-06-19 VITALS — BP 108/66 | HR 113 | Ht 68.0 in | Wt 214.5 lb

## 2021-06-19 DIAGNOSIS — R3915 Urgency of urination: Secondary | ICD-10-CM | POA: Diagnosis not present

## 2021-06-19 DIAGNOSIS — Z3044 Encounter for surveillance of vaginal ring hormonal contraceptive device: Secondary | ICD-10-CM

## 2021-06-19 DIAGNOSIS — R350 Frequency of micturition: Secondary | ICD-10-CM

## 2021-06-19 DIAGNOSIS — R8290 Unspecified abnormal findings in urine: Secondary | ICD-10-CM

## 2021-06-19 LAB — POCT URINALYSIS DIPSTICK
Blood, UA: NEGATIVE
Glucose, UA: NEGATIVE
Ketones, UA: NEGATIVE
Leukocytes, UA: NEGATIVE
Nitrite, UA: NEGATIVE
Protein, UA: NEGATIVE

## 2021-06-19 MED ORDER — ETONOGESTREL-ETHINYL ESTRADIOL 0.12-0.015 MG/24HR VA RING: VAGINAL_RING | VAGINAL | 4 refills | Status: DC

## 2021-06-19 NOTE — Progress Notes (Signed)
°  Subjective:     Patient ID: Karina Clayton, female   DOB: [birth year], 36 y.o.   MRN: [id number]  HPI Karina Clayton is a 36 year old black female,single, G1P1 in for refill on nuvaring and it is doing great. She had urinary frequency and urgency last week and urine had odor, seems better now.  Lab Results  Component Value Date   DIAGPAP  03/19/2021    - Negative for intraepithelial lesion or malignancy (NILM)   HPV NOT DETECTED 02/19/2018   HPVHIGH Negative 03/19/2021    Review of Systems +urinary frequency last week with urgency and odor Reviewed past medical,surgical, social and family history. Reviewed medications and allergies.     Objective:   Physical Exam BP 108/66 (BP Location: Left Arm, Patient Position: Sitting, Cuff Size: Large)    Pulse (!) 113    Ht 5\' 8"  (1.727 m)    Wt 214 lb 8 oz (97.3 kg)    LMP 05/23/2021 (Approximate) Comment: birth control ring   BMI 32.61 kg/m  urine dipstick is negative. Skin warm and dry. Lungs: clear to ausculation bilaterally. Cardiovascular: regular rate and rhythm.    Fall risk is low  Upstream - 06/19/21 0935       Pregnancy Intention Screening   Does the patient want to become pregnant in the next year? Yes    Does the patient's partner want to become pregnant in the next year? Yes    Would the patient like to discuss contraceptive options today? No      Contraception Wrap Up   Current Method Vaginal Ring    End Method Vaginal Ring    Contraception Counseling Provided No             Assessment:     1. Urinary urgency Will send urine for UA C&S  - POCT Urinalysis Dipstick - Urinalysis, Routine w reflex microscopic - Urine Culture  2. Urinary frequency - POCT Urinalysis Dipstick - Urinalysis, Routine w reflex microscopic - Urine Culture  3. Abnormal urine odor  - POCT Urinalysis Dipstick - Urinalysis, Routine w reflex microscopic - Urine Culture  4. Encounter for surveillance of vaginal ring hormonal contraceptive  device Will refill nuva ring Meds ordered this encounter  Medications   etonogestrel-ethinyl estradiol (NUVARING) 0.12-0.015 MG/24HR vaginal ring    Sig: Insert vaginally and leave in place for 3 consecutive weeks, then remove for 1 week.    Dispense:  3 each    Refill:  4    Order Specific Question:   Supervising Provider    Answer:   07-05-1971 [[id number]]       Plan:    Will talk when urine back  Follow up prn

## 2021-06-20 LAB — URINALYSIS, ROUTINE W REFLEX MICROSCOPIC
Bilirubin, UA: NEGATIVE
Glucose, UA: NEGATIVE
Ketones, UA: NEGATIVE
Nitrite, UA: NEGATIVE
Protein,UA: NEGATIVE
RBC, UA: NEGATIVE
Specific Gravity, UA: 1.017 (ref 1.005–1.030)
Urobilinogen, Ur: 1 mg/dL (ref 0.2–1.0)
pH, UA: 6.5 (ref 5.0–7.5)

## 2021-06-20 LAB — MICROSCOPIC EXAMINATION
Casts: NONE SEEN /lpf
RBC, Urine: NONE SEEN /hpf (ref 0–2)

## 2021-06-23 LAB — URINE CULTURE

## 2021-06-24 ENCOUNTER — Other Ambulatory Visit: Payer: Self-pay | Admitting: Adult Health

## 2021-06-24 MED ORDER — NITROFURANTOIN MONOHYD MACRO 100 MG PO CAPS: 100.0000 mg | ORAL_CAPSULE | Freq: Two times a day (BID) | ORAL | 0 refills | Status: DC

## 2021-06-24 NOTE — Progress Notes (Signed)
+  E coli will rx macrobid

## 2021-10-01 ENCOUNTER — Other Ambulatory Visit: Payer: Self-pay | Admitting: Obstetrics & Gynecology

## 2021-11-01 ENCOUNTER — Other Ambulatory Visit: Payer: Self-pay | Admitting: *Deleted

## 2021-11-01 MED ORDER — ETONOGESTREL-ETHINYL ESTRADIOL 0.12-0.015 MG/24HR VA RING: VAGINAL_RING | VAGINAL | 4 refills | Status: DC

## 2022-02-26 ENCOUNTER — Other Ambulatory Visit (HOSPITAL_COMMUNITY)
Admission: RE | Admit: 2022-02-26 | Discharge: 2022-02-26 | Disposition: A | Discharge status: Home / Self Care | Payer: 59 | Source: Ambulatory Visit | Attending: Obstetrics & Gynecology | Admitting: Obstetrics & Gynecology

## 2022-02-26 ENCOUNTER — Other Ambulatory Visit (INDEPENDENT_AMBULATORY_CARE_PROVIDER_SITE_OTHER): Payer: 59 | Admitting: *Deleted

## 2022-02-26 DIAGNOSIS — Z113 Encounter for screening for infections with a predominantly sexual mode of transmission: Secondary | ICD-10-CM | POA: Diagnosis present

## 2022-02-26 NOTE — Progress Notes (Signed)
   NURSE VISIT- VAGINITIS/STD  SUBJECTIVE:  Karina Clayton is a 36 y.o. G1P1001 GYN patientfemale here for a vaginal swab for vaginitis screening, STD screen.  She reports the following symptoms: none  Denies abnormal vaginal bleeding, significant pelvic pain, fever, or UTI symptoms.  OBJECTIVE:  There were no vitals taken for this visit.  Appears well, in no apparent distress  ASSESSMENT: Vaginal swab for vaginitis screening & STD screening  PLAN: Self-collected vaginal probe for Gonorrhea, Chlamydia, Trichomonas, Bacterial Vaginosis, Yeast sent to lab Treatment: to be determined once results are received Follow-up as needed if symptoms persist/worsen, or new symptoms develop  Levy Pupa  02/26/2022 4:25 PM

## 2022-02-28 LAB — CERVICOVAGINAL ANCILLARY ONLY
Bacterial Vaginitis (gardnerella): NEGATIVE
Candida Glabrata: NEGATIVE
Candida Vaginitis: NEGATIVE
Chlamydia: NEGATIVE
Comment: NEGATIVE
Comment: NEGATIVE
Comment: NEGATIVE
Comment: NEGATIVE
Comment: NEGATIVE
Comment: NORMAL
Neisseria Gonorrhea: NEGATIVE
Trichomonas: NEGATIVE

## 2022-03-24 ENCOUNTER — Other Ambulatory Visit: Payer: Self-pay | Admitting: Adult Health

## 2022-03-24 MED ORDER — ETONOGESTREL-ETHINYL ESTRADIOL 0.12-0.015 MG/24HR VA RING: VAGINAL_RING | VAGINAL | 4 refills | Status: DC

## 2022-03-24 NOTE — Progress Notes (Signed)
Rx sent for [facility] to [facility]

## 2022-03-25 ENCOUNTER — Other Ambulatory Visit: Payer: Self-pay | Admitting: Adult Health

## 2022-03-25 MED ORDER — ETONOGESTREL-ETHINYL ESTRADIOL 0.12-0.015 MG/24HR VA RING: VAGINAL_RING | VAGINAL | 4 refills | Status: DC

## 2022-03-25 NOTE — Progress Notes (Signed)
Rx sent to [facility] for nuva ring

## 2022-07-27 ENCOUNTER — Emergency Department (HOSPITAL_COMMUNITY): Payer: 59

## 2022-07-27 ENCOUNTER — Emergency Department (HOSPITAL_COMMUNITY)
Admission: EM | Admit: 2022-07-27 | Discharge: 2022-07-27 | Disposition: A | Discharge status: Home / Self Care | Payer: 59 | Attending: Emergency Medicine | Admitting: Emergency Medicine

## 2022-07-27 DIAGNOSIS — S8012XA Contusion of left lower leg, initial encounter: Secondary | ICD-10-CM | POA: Insufficient documentation

## 2022-07-27 DIAGNOSIS — Y92410 Unspecified street and highway as the place of occurrence of the external cause: Secondary | ICD-10-CM | POA: Insufficient documentation

## 2022-07-27 DIAGNOSIS — S8992XA Unspecified injury of left lower leg, initial encounter: Secondary | ICD-10-CM | POA: Diagnosis present

## 2022-07-27 MED ORDER — NAPROXEN 250 MG PO TABS
500.0000 mg | ORAL_TABLET | Freq: Once | ORAL | Status: AC
  Administered 2022-07-27: 500 mg via ORAL
  Filled 2022-07-27: qty 2

## 2022-07-27 MED ORDER — NAPROXEN 500 MG PO TABS: 500.0000 mg | ORAL_TABLET | Freq: Two times a day (BID) | ORAL | 0 refills | Status: DC
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
## 2022-07-27 NOTE — ED Notes (Signed)
Pt transported to XRay 

## 2022-07-27 NOTE — Discharge Instructions (Addendum)
Your x-rays look normal, no signs of broken bones, please apply ice intermittently as needed and use ibuprofen or Naprosyn which I prescribed to help with the pain.  This should get better over the next week  You will likely have some ongoing pain in your neck and your back and maybe even headaches over the next week, this is to be expected  Thank you for allowing [place] to treat you in the emergency department today.  After reviewing your examination and potential testing that was done it appears that you are safe to go home.  I would like for you to follow-up with your doctor within the next several days, have them obtain your results and follow-up with them to review all of these tests.  If you should develop severe or worsening symptoms return to the emergency department immediately
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
## 2022-08-13 ENCOUNTER — Ambulatory Visit (HOSPITAL_COMMUNITY)
Admission: RE | Admit: 2022-08-13 | Discharge: 2022-08-13 | Disposition: A | Discharge status: Home / Self Care | Payer: 59 | Source: Ambulatory Visit | Attending: Family Medicine | Admitting: Family Medicine

## 2022-08-13 ENCOUNTER — Encounter: Payer: Self-pay | Admitting: Family Medicine

## 2022-08-13 ENCOUNTER — Ambulatory Visit (INDEPENDENT_AMBULATORY_CARE_PROVIDER_SITE_OTHER): Payer: 59 | Admitting: Family Medicine

## 2022-08-13 VITALS — BP 138/89 | HR 94 | Ht 67.0 in | Wt 223.0 lb

## 2022-08-13 DIAGNOSIS — Z114 Encounter for screening for human immunodeficiency virus [HIV]: Secondary | ICD-10-CM

## 2022-08-13 DIAGNOSIS — R7301 Impaired fasting glucose: Secondary | ICD-10-CM | POA: Diagnosis not present

## 2022-08-13 DIAGNOSIS — I10 Essential (primary) hypertension: Secondary | ICD-10-CM | POA: Diagnosis not present

## 2022-08-13 DIAGNOSIS — M79605 Pain in left leg: Secondary | ICD-10-CM | POA: Insufficient documentation

## 2022-08-13 DIAGNOSIS — E039 Hypothyroidism, unspecified: Secondary | ICD-10-CM

## 2022-08-13 DIAGNOSIS — E785 Hyperlipidemia, unspecified: Secondary | ICD-10-CM | POA: Diagnosis not present

## 2022-08-13 DIAGNOSIS — Z1159 Encounter for screening for other viral diseases: Secondary | ICD-10-CM

## 2022-08-13 NOTE — Patient Instructions (Signed)
It was pleasure meeting with you today. Please take medications as prescribed. Follow up with your primary health provider if any health concerns arises. If symptoms worsen please contact your primary care provider and/or visit the emergency department.
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
## 2022-08-13 NOTE — Assessment & Plan Note (Signed)
Left leg Xray ordered- will follow up awaiting results Explained to patient Non pharmacological interventions include the use of ice or heat, rest, recommend range of motion exercises, gentle stretching. The use of NSAIDs for pain management.  Follow up for worsening or persistent symptoms. Patient verbalizes understanding regarding plan of care and all questions answered.

## 2022-08-14 ENCOUNTER — Encounter: Payer: Self-pay | Admitting: Family Medicine

## 2022-08-16 LAB — CBC WITH DIFFERENTIAL/PLATELET
Basophils Absolute: 0 10*3/uL (ref 0.0–0.2)
Basos: 1 %
EOS (ABSOLUTE): 0.1 10*3/uL (ref 0.0–0.4)
Eos: 2 %
Hematocrit: 40.6 % (ref 34.0–46.6)
Hemoglobin: 14.1 g/dL (ref 11.1–15.9)
Immature Grans (Abs): 0 10*3/uL (ref 0.0–0.1)
Immature Granulocytes: 0 %
Lymphocytes Absolute: 2.7 10*3/uL (ref 0.7–3.1)
Lymphs: 51 %
MCH: 30.5 pg (ref 26.6–33.0)
MCHC: 34.7 g/dL (ref 31.5–35.7)
MCV: 88 fL (ref 79–97)
Monocytes Absolute: 0.3 10*3/uL (ref 0.1–0.9)
Monocytes: 5 %
Neutrophils Absolute: 2.1 10*3/uL (ref 1.4–7.0)
Neutrophils: 41 %
Platelets: 295 10*3/uL (ref 150–450)
RBC: 4.63 x10E6/uL (ref 3.77–5.28)
RDW: 12.4 % (ref 11.7–15.4)
WBC: 5.3 10*3/uL (ref 3.4–10.8)

## 2022-08-16 LAB — CMP14+EGFR
ALT: 14 IU/L (ref 0–32)
AST: 16 IU/L (ref 0–40)
Albumin/Globulin Ratio: 1.7 (ref 1.2–2.2)
Albumin: 4.7 g/dL (ref 3.9–4.9)
Alkaline Phosphatase: 86 IU/L (ref 44–121)
BUN/Creatinine Ratio: 8 — ABNORMAL LOW (ref 9–23)
BUN: 8 mg/dL (ref 6–20)
Bilirubin Total: 0.4 mg/dL (ref 0.0–1.2)
CO2: 20 mmol/L (ref 20–29)
Calcium: 10.2 mg/dL (ref 8.7–10.2)
Chloride: 102 mmol/L (ref 96–106)
Creatinine, Ser: 1.06 mg/dL — ABNORMAL HIGH (ref 0.57–1.00)
Globulin, Total: 2.8 g/dL (ref 1.5–4.5)
Glucose: 104 mg/dL — ABNORMAL HIGH (ref 70–99)
Potassium: 4.1 mmol/L (ref 3.5–5.2)
Sodium: 141 mmol/L (ref 134–144)
Total Protein: 7.5 g/dL (ref 6.0–8.5)
eGFR: 71 mL/min/{1.73_m2} (ref >59)

## 2022-08-16 LAB — LIPID PANEL
Chol/HDL Ratio: 2.8 ratio (ref 0.0–4.4)
Cholesterol, Total: 202 mg/dL — ABNORMAL HIGH (ref 100–199)
HDL: 72 mg/dL (ref >39)
LDL Chol Calc (NIH): 114 mg/dL — ABNORMAL HIGH (ref 0–99)
Triglycerides: 91 mg/dL (ref 0–149)
VLDL Cholesterol Cal: 16 mg/dL (ref 5–40)

## 2022-08-16 LAB — MICROALBUMIN / CREATININE URINE RATIO
Creatinine, Urine: 51.9 mg/dL
Microalb/Creat Ratio: <6 mg/g creat (ref 0–29)
Microalbumin, Urine: <3 ug/mL

## 2022-08-16 LAB — HEMOGLOBIN A1C
Est. average glucose Bld gHb Est-mCnc: 120 mg/dL
Hgb A1c MFr Bld: 5.8 % — ABNORMAL HIGH (ref 4.8–5.6)

## 2022-08-16 LAB — TSH+FREE T4
Free T4: 1.25 ng/dL (ref 0.82–1.77)
TSH: 1.63 u[IU]/mL (ref 0.450–4.500)

## 2022-08-16 LAB — HIV ANTIBODY (ROUTINE TESTING W REFLEX): HIV Screen 4th Generation wRfx: NONREACTIVE

## 2022-08-16 LAB — HEPATITIS C ANTIBODY: Hep C Virus Ab: NONREACTIVE

## 2022-08-27 ENCOUNTER — Ambulatory Visit (INDEPENDENT_AMBULATORY_CARE_PROVIDER_SITE_OTHER): Payer: 59 | Admitting: Family Medicine

## 2022-08-27 ENCOUNTER — Encounter: Payer: Self-pay | Admitting: Family Medicine

## 2022-08-27 VITALS — BP 130/86 | HR 92 | Ht 67.0 in | Wt 224.0 lb

## 2022-08-27 DIAGNOSIS — E669 Obesity, unspecified: Secondary | ICD-10-CM

## 2022-08-27 NOTE — Assessment & Plan Note (Signed)
Started patient on weight management plan Discussed the importance to start eating 3 meals a day including breakfast, drink 8 glasses of water a day ,reduce portion sizes. reduced carbohydrates limit saturated and trans fat, increase servings of vegetables and limit processed foods. Find an activity that you will enjoy and start to be active at least 5 days a week for 30 minutes each day. Keep a food journal or an activity journal to identify triggers that lead to emotional eating Follow up in 4 weeks for Weight loss management   

## 2022-08-27 NOTE — Patient Instructions (Signed)
It was pleasure meeting with you today. Please take medications as prescribed. Follow up with your primary health provider if any health concerns arises.  
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
## 2022-09-24 ENCOUNTER — Ambulatory Visit (INDEPENDENT_AMBULATORY_CARE_PROVIDER_SITE_OTHER): Payer: 59 | Admitting: Family Medicine

## 2022-09-24 VITALS — BP 123/89 | HR 86 | Ht 68.0 in | Wt 223.1 lb

## 2022-09-24 DIAGNOSIS — Z6833 Body mass index (BMI) 33.0-33.9, adult: Secondary | ICD-10-CM | POA: Diagnosis not present

## 2022-09-24 DIAGNOSIS — E6609 Other obesity due to excess calories: Secondary | ICD-10-CM

## 2022-09-24 NOTE — Patient Instructions (Signed)
It was pleasure meeting with you today. Follow up with your primary health provider if any health concerns arises.   

## 2022-09-24 NOTE — Progress Notes (Signed)
Patient Office Visit   Subjective   Patient ID: Karina Clayton, female    DOB: 1986/02/06  Age: 37 y.o. MRN: [id number]  CC:  Chief Complaint  Patient presents with   Weight Loss    HPI PA TENNANT 37 year old female, presents to the clinic for weight loss follow up. She  has a past medical history of Abnormal Pap smear, BV (bacterial vaginosis) (04/12/2014), Contraceptive management (05/16/2013), Eczema, Essential hypertension, benign (06/06/2019), Headache(784.0), Obesity (BMI 30.0-34.9) (06/06/2019), PMS (premenstrual syndrome) (06/06/2019), Psoriasis, Screening for STD (sexually transmitted disease) (05/16/2013), Vaginal discharge (04/12/2014), Vaginal Pap smear, abnormal, and Vitamin D deficiency disease (06/06/2019).  Patient is here for weight loss follow up healthy eating habits include: Patient reported for breakfast consumes two packs of oatmeal with one boiled egg.  Lunch consist of chicken wrap with fruits intake including tangrine, apples, or bananas. Dinner reports eating vegetables such as green beans  broccoli with mashed potoes and grilled chicken. For physical activities patient is incorporating walking one mile twice a week. Behavior mindset includes - avoiding trigger foods.      Outpatient Encounter Medications as of 09/24/2022  Medication Sig   etonogestrel-ethinyl estradiol (NUVARING) 0.12-0.015 MG/24HR vaginal ring Insert vaginally and leave in place for 3 consecutive weeks, then remove for 1 week.   Multiple Vitamin (MULTIVITAMIN) tablet Take 1 tablet by mouth daily.   naproxen (NAPROSYN) 500 MG tablet Take 1 tablet (500 mg total) by mouth 2 (two) times daily with a meal.   No facility-administered encounter medications on file as of 09/24/2022.    Past Surgical History:  Procedure Laterality Date   COLPOSCOPY W/ BIOPSY / CURETTAGE     NO PAST SURGERIES      Review of Systems  Constitutional:  Negative for chills and fever.  HENT:  Negative for hearing  loss.   Eyes:  Negative for blurred vision.  Respiratory:  Negative for shortness of breath.   Cardiovascular:  Negative for chest pain and palpitations.  Gastrointestinal:  Negative for abdominal pain and vomiting.  Genitourinary:  Negative for dysuria and urgency.  Musculoskeletal:  Negative for myalgias.  Neurological:  Negative for dizziness and headaches.  Psychiatric/Behavioral:  Negative for depression.       Objective    BP 123/89   Pulse 86   Ht  (1.727 m)   Wt 223 lb 1.3 oz (101.2 kg)   SpO2 97%   BMI 33.92 kg/m   Physical Exam Vitals reviewed.  Constitutional:      General: She is not in acute distress.    Appearance: Normal appearance. She is not ill-appearing, toxic-appearing or diaphoretic.  HENT:     Head: Normocephalic.  Eyes:     General:        Right eye: No discharge.        Left eye: No discharge.     Conjunctiva/sclera: Conjunctivae normal.  Cardiovascular:     Rate and Rhythm: Normal rate.     Pulses: Normal pulses.     Heart sounds: Normal heart sounds.  Pulmonary:     Effort: Pulmonary effort is normal. No respiratory distress.     Breath sounds: Normal breath sounds.  Abdominal:     General: Bowel sounds are normal.     Palpations: Abdomen is soft.     Tenderness: There is no abdominal tenderness. There is no right CVA tenderness, left CVA tenderness or guarding.  Musculoskeletal:        General:  Normal range of motion.     Cervical back: Normal range of motion.  Skin:    General: Skin is warm and dry.     Capillary Refill: Capillary refill takes less than 2 seconds.  Neurological:     General: No focal deficit present.     Mental Status: She is alert and oriented to person, place, and time.     Coordination: Coordination normal.     Gait: Gait normal.  Psychiatric:        Mood and Affect: Mood normal.       Assessment & Plan:  Class 1 obesity due to excess calories with serious comorbidity and body mass index (BMI) of 33.0 to  33.9 in adult Assessment & Plan: Patient loss 1 lbs since last visit  LDL 114  not at goal , LDL Goal <70 Referral to Provider Exercise Program  Discussed lifestyle modifications follow diet low in saturated fat, reduce dietary salt intake, avoid fatty foods, maintain an exercise routine 3 to 5 days a week for a minimum total of 150 minutes.   Orders: -     Amb Referral To Provider Referral Exercise Program (P.R.E.P)    Return in about 4 weeks (around 10/22/2022) for Weight Loss Mangment.   Cruzita Lederer Newman Nip, FNP

## 2022-09-24 NOTE — Assessment & Plan Note (Signed)
Patient loss 1 lbs since last visit  LDL 114  not at goal , LDL Goal <70 Referral to Provider Exercise Program  Discussed lifestyle modifications follow diet low in saturated fat, reduce dietary salt intake, avoid fatty foods, maintain an exercise routine 3 to 5 days a week for a minimum total of 150 minutes.

## 2022-10-15 ENCOUNTER — Ambulatory Visit: Payer: 59 | Admitting: Allergy & Immunology

## 2022-10-24 ENCOUNTER — Ambulatory Visit: Payer: 59 | Admitting: Family Medicine

## 2022-11-10 ENCOUNTER — Encounter: Payer: Self-pay | Admitting: Family Medicine

## 2022-11-10 ENCOUNTER — Ambulatory Visit (INDEPENDENT_AMBULATORY_CARE_PROVIDER_SITE_OTHER): Payer: 59 | Admitting: Family Medicine

## 2022-11-10 VITALS — BP 133/76 | HR 96 | Ht 68.0 in | Wt 227.0 lb

## 2022-11-10 DIAGNOSIS — E6609 Other obesity due to excess calories: Secondary | ICD-10-CM | POA: Diagnosis not present

## 2022-11-10 DIAGNOSIS — Z6834 Body mass index (BMI) 34.0-34.9, adult: Secondary | ICD-10-CM | POA: Diagnosis not present

## 2022-11-10 MED ORDER — PHENTERMINE HCL 15 MG PO CAPS
15.0000 mg | ORAL_CAPSULE | ORAL | 0 refills | Status: DC
Start: 2022-11-10 — End: 2022-12-08

## 2022-11-10 NOTE — Assessment & Plan Note (Signed)
Started phentermine 15 mg daily Follow up in 4 weeks Continued discussion adhering to weight loss plan, with strong emphasize on nutrition and exercise. Read food labels to know how many calories are in each serving , Increase water intake 2-3 L a day. Include more protein intake such as lean meat, poultry, fish. Increase fiber intake such as vegetables, whole grains, fruits, artichokes, green peas, broccoli, lentils and lima beans.

## 2022-11-10 NOTE — Progress Notes (Signed)
Patient Office Visit   Subjective   Patient ID: Karina Clayton, female    DOB: 28-Feb-1986  Age: 37 y.o. MRN: [id number]  CC:  Chief Complaint  Patient presents with   Weight Loss    Patient is here for weight loss f/u. No changes or concerns since last visit.     HPI Karina Clayton 36 year old female, presents to the clinic for weight loss management She  has a past medical history of Abnormal Pap smear, BV (bacterial vaginosis) (04/12/2014), Contraceptive management (05/16/2013), Eczema, Essential hypertension, benign (06/06/2019), Headache(784.0), Obesity (BMI 30.0-34.9) (06/06/2019), PMS (premenstrual syndrome) (06/06/2019), Psoriasis, Screening for STD (sexually transmitted disease) (05/16/2013), Vaginal discharge (04/12/2014), Vaginal Pap smear, abnormal, and Vitamin D deficiency disease (06/06/2019).For the details of today's visit, please refer to assessment and plan.   HPI    Outpatient Encounter Medications as of 11/10/2022  Medication Sig   etonogestrel-ethinyl estradiol (NUVARING) 0.12-0.015 MG/24HR vaginal ring Insert vaginally and leave in place for 3 consecutive weeks, then remove for 1 week.   Multiple Vitamin (MULTIVITAMIN) tablet Take 1 tablet by mouth daily.   naproxen (NAPROSYN) 500 MG tablet Take 1 tablet (500 mg total) by mouth 2 (two) times daily with a meal.   phentermine 15 MG capsule Take 1 capsule (15 mg total) by mouth every morning.   No facility-administered encounter medications on file as of 11/10/2022.    Past Surgical History:  Procedure Laterality Date   COLPOSCOPY W/ BIOPSY / CURETTAGE     NO PAST SURGERIES      Review of Systems  Constitutional:  Negative for chills and fever.  Eyes:  Negative for double vision.  Respiratory:  Negative for shortness of breath.   Cardiovascular:  Negative for chest pain.  Gastrointestinal:  Negative for abdominal pain.  Genitourinary:  Negative for dysuria.  Musculoskeletal:  Negative for myalgias.  Skin:   Negative for rash.  Neurological:  Negative for dizziness and headaches.      Objective    BP 133/85   Pulse 96   Ht 5\' 8"  (1.727 m)   Wt 227 lb (103 kg)   SpO2 98%   BMI 34.52 kg/m   Physical Exam Vitals reviewed.  Constitutional:      General: She is not in acute distress.    Appearance: Normal appearance. She is not ill-appearing, toxic-appearing or diaphoretic.  HENT:     Head: Normocephalic.  Eyes:     General:        Right eye: No discharge.        Left eye: No discharge.     Conjunctiva/sclera: Conjunctivae normal.  Cardiovascular:     Rate and Rhythm: Normal rate.     Pulses: Normal pulses.     Heart sounds: Normal heart sounds.  Pulmonary:     Effort: Pulmonary effort is normal. No respiratory distress.     Breath sounds: Normal breath sounds.  Musculoskeletal:        General: Normal range of motion.     Cervical back: Normal range of motion.  Skin:    General: Skin is warm and dry.     Capillary Refill: Capillary refill takes less than 2 seconds.  Neurological:     General: No focal deficit present.     Mental Status: She is alert and oriented to person, place, and time.     Coordination: Coordination normal.     Gait: Gait normal.  Psychiatric:  Mood and Affect: Mood normal.        Behavior: Behavior normal.       Assessment & Plan:  Class 1 obesity due to excess calories with serious comorbidity and body mass index (BMI) of 34.0 to 34.9 in adult Assessment & Plan: Started phentermine 15 mg daily Follow up in 4 weeks Continued discussion adhering to weight loss plan, with strong emphasize on nutrition and exercise. Read food labels to know how many calories are in each serving , Increase water intake 2-3 L a day. Include more protein intake such as lean meat, poultry, fish. Increase fiber intake such as vegetables, whole grains, fruits, artichokes, green peas, broccoli, lentils and lima beans.   Orders: -     Phentermine HCl; Take 1 capsule  (15 mg total) by mouth every morning.  Dispense: 30 capsule; Refill: 0    Return in about 4 weeks (around 12/08/2022) for Weight Loss Mangment.   Karina Lederer Newman Nip, FNP

## 2022-11-10 NOTE — Patient Instructions (Signed)
        Great to see you today.   - Please take medications as prescribed. - Follow up with your primary health provider if any health concerns arises. - If symptoms worsen please contact your primary care provider and/or visit the emergency department.  

## 2022-11-14 ENCOUNTER — Ambulatory Visit (INDEPENDENT_AMBULATORY_CARE_PROVIDER_SITE_OTHER): Payer: 59 | Admitting: Allergy & Immunology

## 2022-11-14 ENCOUNTER — Other Ambulatory Visit: Payer: Self-pay

## 2022-11-14 ENCOUNTER — Encounter: Payer: Self-pay | Admitting: Allergy & Immunology

## 2022-11-14 VITALS — BP 132/88 | HR 120 | Temp 98.4°F | Resp 18 | Ht 67.0 in | Wt 225.0 lb

## 2022-11-14 DIAGNOSIS — L2089 Other atopic dermatitis: Secondary | ICD-10-CM

## 2022-11-14 DIAGNOSIS — J310 Chronic rhinitis: Secondary | ICD-10-CM

## 2022-11-14 DIAGNOSIS — T7800XD Anaphylactic reaction due to unspecified food, subsequent encounter: Secondary | ICD-10-CM

## 2022-11-14 NOTE — Progress Notes (Unsigned)
NEW PATIENT  Date of Service/Encounter:  11/14/22  Consult requested by: Rica Records, FNP   Assessment:   No diagnosis found.  Plan/Recommendations:    There are no Patient Instructions on file for this visit.   {Blank single:19197::"This note in its entirety was forwarded to the Provider who requested this consultation."}  Subjective:   Karina Clayton is a 37 y.o. female presenting today for evaluation of  Chief Complaint  Patient presents with   Allergic Rhinitis     Itchy,watery,red eyes    Eczema    Karina Clayton has a history of the following: Patient Active Problem List   Diagnosis Date Noted   Left leg pain 08/13/2022   Encounter for surveillance of vaginal ring hormonal contraceptive device 06/19/2021   Abnormal urine odor 06/19/2021   Urinary frequency 06/19/2021   Urinary urgency 06/19/2021   Essential hypertension, benign 06/06/2019   Vitamin D deficiency disease 06/06/2019   PMS (premenstrual syndrome) 06/06/2019   Class 1 obesity with serious comorbidity in adult 06/06/2019   BV (bacterial vaginosis) 04/12/2014   Migraine headache with aura 08/21/2012   Psoriasis 08/21/2012    History obtained from: chart review and {Persons; PED relatives w/patient:19415::"patient"}.  Karina Clayton was referred by Rica Records, FNP.     Karina Clayton is a 37 y.o. female presenting for {Blank single:19197::"a food challenge","a drug challenge","skin testing","a sick visit","an evaluation of ***","a follow up visit"}.   {Blank single:19197::"Asthma/Respiratory Symptom History: ***"," "}  Allergic Rhinitis Symptom History: She is no longer having issues with her eyes.  She had really red itchy eyes one month ago. They lasted for weeks. They were not like this last year at all. These improved despite her using the same lashes. She has been using these lashes since 2018. She does not have the postnasal drip or the rhinorrhea. She was tested  when she ws a child in 3rd grade and she was tested when she had her son.   Food Allergy Symptom History: She is allergic to cow's milk She would have anaphylaxis. She would end up in the ED. She actually was seen by Dr. Willa Rough. She is consuming nut milks now. She does eat milk baked into product. She does not try yogurt.  Skin Symptom History: She has had eczema  for a few years. She stopped one of her products and it improved. She has been using her son's eczema cream which is helping a lot. She has not seen Dermatology. She started having issues around 3 weeks ago where it worsened.   {Blank single:19197::"GERD Symptom History: ***"," "}  She recently start phentermine for weight loss.   ***Otherwise, there is no history of other atopic diseases, including {Blank multiple:19196:o:"asthma","food allergies","drug allergies","environmental allergies","stinging insect allergies","eczema","urticaria","contact dermatitis"}. There is no significant infectious history. ***Vaccinations are up to date.    Past Medical History: Patient Active Problem List   Diagnosis Date Noted   Left leg pain 08/13/2022   Encounter for surveillance of vaginal ring hormonal contraceptive device 06/19/2021   Abnormal urine odor 06/19/2021   Urinary frequency 06/19/2021   Urinary urgency 06/19/2021   Essential hypertension, benign 06/06/2019   Vitamin D deficiency disease 06/06/2019   PMS (premenstrual syndrome) 06/06/2019   Class 1 obesity with serious comorbidity in adult 06/06/2019   BV (bacterial vaginosis) 04/12/2014   Migraine headache with aura 08/21/2012   Psoriasis 08/21/2012    Medication List:  Allergies as of 11/14/2022  Reactions   Ciprofloxacin Anaphylaxis, Hives, Itching   Milk-related Compounds Anaphylaxis   Bactrim [sulfamethoxazole-trimethoprim] Hives   Itchy watery eyes   Other         Medication List        Accurate as of November 14, 2022  2:13 PM. If you have any questions, ask  your nurse or doctor.          etonogestrel-ethinyl estradiol 0.12-0.015 MG/24HR vaginal ring Commonly known as: NUVARING Insert vaginally and leave in place for 3 consecutive weeks, then remove for 1 week.   multivitamin tablet Take 1 tablet by mouth daily.   naproxen 500 MG tablet Commonly known as: Naprosyn Take 1 tablet (500 mg total) by mouth 2 (two) times daily with a meal.   phentermine 15 MG capsule Take 1 capsule (15 mg total) by mouth every morning.        Birth History: {Blank single:19197::"non-contributory","born premature and spent time in the NICU","born at term without complications"}  Developmental History: Karina Clayton has met all milestones on time. She has required no {Blank multiple:19196:a:"speech therapy","occupational therapy","physical therapy"}. ***non-contributory  Past Surgical History: Past Surgical History:  Procedure Laterality Date   COLPOSCOPY W/ BIOPSY / CURETTAGE     NO PAST SURGERIES       Family History: Family History  Problem Relation Age of Onset   Coronary artery disease Paternal Grandfather    Coronary artery disease Paternal Grandmother    Hypertension Maternal Grandmother    Cancer Maternal Grandfather        stomach   Bipolar disorder Sister    Hypertension Sister    Bipolar disorder Sister    Cancer Maternal Uncle        prostate   Multiple sclerosis Maternal Aunt    Heart disease Father    Hypertension Maternal Aunt    Diabetes Other        paternal great uncle     Social History: Karina Clayton lives at home with ***.    ROS     Objective:   Blood pressure 132/88, pulse (!) 120, temperature 98.4 F (36.9 C), resp. rate 18, height 5\' 7"  (1.702 m), weight 225 lb (102.1 kg), SpO2 96 %. Body mass index is 35.24 kg/m.     Physical Exam   Diagnostic studies: {Blank single:19197::"none","deferred due to recent antihistamine use","deferred due to insurance stipulations that refuse to pay for testing at initial  visits, making it more difficult for patients to get the care they need","labs sent instead"," "}  Spirometry: {Blank single:19197::"results normal (FEV1: ***%, FVC: ***%, FEV1/FVC: ***%)","results abnormal (FEV1: ***%, FVC: ***%, FEV1/FVC: ***%)"}.    {Blank single:19197::"Spirometry consistent with mild obstructive disease","Spirometry consistent with moderate obstructive disease","Spirometry consistent with severe obstructive disease","Spirometry consistent with possible restrictive disease","Spirometry consistent with mixed obstructive and restrictive disease","Spirometry uninterpretable due to technique","Spirometry consistent with normal pattern"}. {Blank single:19197::"Albuterol/Atrovent nebulizer","Xopenex/Atrovent nebulizer","Albuterol nebulizer","Albuterol four puffs via MDI","Xopenex four puffs via MDI"} treatment given in clinic with {Blank single:19197::"significant improvement in FEV1 per ATS criteria","significant improvement in FVC per ATS criteria","significant improvement in FEV1 and FVC per ATS criteria","improvement in FEV1, but not significant per ATS criteria","improvement in FVC, but not significant per ATS criteria","improvement in FEV1 and FVC, but not significant per ATS criteria","no improvement"}.  Allergy Studies: {Blank single:19197::"none","labs sent instead"," "}    {Blank single:19197::"Allergy testing results were read and interpreted by myself, documented by clinical staff."," "}         Malachi Bonds, MD [facility]

## 2022-11-14 NOTE — Patient Instructions (Signed)
1. Chronic rhinitis - We will do testing NEXT Wednesday at 1:30pm. - We will know more at that time.   2. Anaphylactic shock due to food - We will do milk testing next week as well. - Think of any other foods that you are concerned with and we can test those.   3. Flexural atopic dermatitis - Hopefully testing will be enlightening for your skin. - We will come up with a better plan next week.  4. Follow up on Wednesday at 1:30 for testing.    Please inform us of any Emergency Department visits, hospitalizations, or changes in symptoms. Call us before going to the ED for breathing or allergy symptoms since we might be able to fit you in for a sick visit. Feel free to contact us anytime with any questions, problems, or concerns.  It was a pleasure to see you again today!  Websites that have reliable patient information: 1. American Academy of Asthma, Allergy, and Immunology: www.aaaai.org 2. Food Allergy Research and Education (FARE): foodallergy.org 3. Mothers of Asthmatics: http://www.asthmacommunitynetwork.org 4. American College of Allergy, Asthma, and Immunology: www.acaai.org   COVID-19 Vaccine Information can be found at: [email or web address].[email or web address] For questions related to vaccine distribution or appointments, please email [email or web address]@ [email or web address] or call [phone].   We realize that you might be concerned about having an allergic reaction to the COVID19 vaccines. To help with that concern, WE ARE OFFERING THE COVID19 VACCINES IN OUR OFFICE! Ask the front desk for dates!     "Like" [place] on Facebook and Instagram for our latest updates!      A healthy democracy works best when [employer] participate! Make sure you are registered to vote! If you have moved or changed any of your contact information, you will need to get this updated before voting!  In some cases, you MAY be able to register to vote online:  [email or web address]

## 2022-11-17 ENCOUNTER — Encounter: Payer: Self-pay | Admitting: Allergy & Immunology

## 2022-11-19 ENCOUNTER — Other Ambulatory Visit: Payer: Self-pay

## 2022-11-19 ENCOUNTER — Ambulatory Visit (INDEPENDENT_AMBULATORY_CARE_PROVIDER_SITE_OTHER): Payer: 59 | Admitting: Allergy & Immunology

## 2022-11-19 ENCOUNTER — Encounter: Payer: Self-pay | Admitting: Allergy & Immunology

## 2022-11-19 VITALS — BP 130/90 | HR 96 | Temp 98.3°F | Resp 18

## 2022-11-19 DIAGNOSIS — L2089 Other atopic dermatitis: Secondary | ICD-10-CM

## 2022-11-19 DIAGNOSIS — T7800XD Anaphylactic reaction due to unspecified food, subsequent encounter: Secondary | ICD-10-CM

## 2022-11-19 DIAGNOSIS — J302 Other seasonal allergic rhinitis: Secondary | ICD-10-CM

## 2022-11-19 DIAGNOSIS — J3089 Other allergic rhinitis: Secondary | ICD-10-CM | POA: Diagnosis not present

## 2022-11-19 MED ORDER — FLUOCINOLONE ACETONIDE 0.01 % EX SOLN: Freq: Two times a day (BID) | CUTANEOUS | 2 refills | Status: DC

## 2022-11-19 MED ORDER — TRIAMCINOLONE ACETONIDE 0.1 % EX OINT: 1.0000 | TOPICAL_OINTMENT | Freq: Two times a day (BID) | CUTANEOUS | 1 refills | Status: AC

## 2022-11-19 MED ORDER — CETIRIZINE HCL 10 MG PO TABS: 10.0000 mg | ORAL_TABLET | Freq: Every day | ORAL | 1 refills | Status: DC

## 2022-11-19 NOTE — Progress Notes (Signed)
FOLLOW UP  Date of Service/Encounter:  11/19/22   Assessment:   Seasonal and perennial allergic rhinitis (grasses, ragweed, weeds, trees, indoor molds, outdoor molds, dust mites, cat, dog, and cockroach)  Plan/Recommendations:   1. Chronic rhinitis .- Testing today showed: grasses, ragweed, weeds, trees, indoor molds, outdoor molds, dust mites, cat, dog, and cockroach - Copy of test results provided.  - Avoidance measures provided. - Start taking: Zyrtec (cetirizine) 10mg  tablet once daily and Singulair (montelukast) 10mg  daily - You can use an extra dose of the antihistamine, if needed, for breakthrough symptoms.  - Consider nasal saline rinses 1-2 times daily to remove allergens from the nasal cavities as well as help with mucous clearance (this is especially helpful to do before the nasal sprays are given) - Consider allergy shots as a means of long-term control. - Allergy shots "re-train" and "reset" the immune system to ignore environmental allergens and decrease the resulting immune response to those allergens (sneezing, itchy watery eyes, runny nose, nasal congestion, etc).    - Allergy shots improve symptoms in 75-85% of patients.  - We can discuss more at the next appointment if the medications are not working for you.  2. Anaphylactic shock due to food - We did testing that was negative to milk and casein.  - You can introduce this at home.   3. Flexural atopic dermatitis - Hopefully testing will be enlightening for your skin. - Continue with moisturizing twice daily. - Add on triamcinolone 0.1% ointment twice daily as needed for flares (do NOT use on the face).   4. Follow up in 4 months or earlier if needed (try to get it the same day as your son).    Subjective:   Karina Clayton is a 37 y.o. female presenting today for follow up of  Chief Complaint  Patient presents with   Allergy Testing    SHAILAH GIBBINS has a history of the following: Patient Active  Problem List   Diagnosis Date Noted   Left leg pain 08/13/2022   Encounter for surveillance of vaginal ring hormonal contraceptive device 06/19/2021   Abnormal urine odor 06/19/2021   Urinary frequency 06/19/2021   Urinary urgency 06/19/2021   Essential hypertension, benign 06/06/2019   Vitamin D deficiency disease 06/06/2019   PMS (premenstrual syndrome) 06/06/2019   Class 1 obesity with serious comorbidity in adult 06/06/2019   BV (bacterial vaginosis) 04/12/2014   Migraine headache with aura 08/21/2012   Psoriasis 08/21/2012    History obtained from: chart review and patient.  Johany is a 38 y.o. female presenting for skin testing. She was last seen in June 2024 around one week ago. She presented with rhinitis as well as good allergies and atopic dermatitis.   Since the last visit, she has done well. She has been off of her antihistamines for the testing today.   Otherwise, there have been no changes to her past medical history, surgical history, family history, or social history.    Review of Systems  Constitutional: Negative.  Negative for chills, fever, malaise/fatigue and weight loss.  HENT:  Positive for congestion. Negative for ear discharge, ear pain and sinus pain.   Eyes:  Negative for pain, discharge and redness.  Respiratory:  Negative for cough, sputum production, shortness of breath and wheezing.   Cardiovascular: Negative.  Negative for chest pain and palpitations.  Gastrointestinal:  Negative for abdominal pain, constipation, diarrhea, heartburn, nausea and vomiting.  Skin: Negative.  Negative for itching and rash.  Neurological:  Negative for dizziness and headaches.  Endo/Heme/Allergies:  Positive for environmental allergies. Does not bruise/bleed easily.       Objective:   Blood pressure (!) 130/90, pulse 96, temperature 98.3 F (36.8 C), resp. rate 18, SpO2 95 %. There is no height or weight on file to calculate BMI.   Physical exam deferred since  this was a skin testing appointment only.  Diagnostic studies:    Allergy Studies:     Airborne Adult Perc - 11/19/22 1351     Time Antigen Placed 1351    Allergen Manufacturer Waynette Buttery    Location Back    Number of Test 55    1. Control-Buffer 50% Glycerol Negative    2. Control-Histamine 2+    3. Bahia Negative    4. [place] Negative    5. Johnson Negative    6. Kentucky Blue Negative    7. Meadow Fescue 2+    8. Perennial Rye 3+    9. Timothy 2+    10. Ragweed Mix Negative    11. Cocklebur 2+    12. Plantain,  English 2+    13. Baccharis Negative    14. Dog Fennel Negative    15. Russian Thistle Negative    16. Lamb's Quarters Negative    17. Sheep Sorrell 2+    18. Rough Pigweed 2+    19. Marsh Elder, Rough 2+    20. Mugwort, Common 2+    21. Box, Elder Negative    22. Cedar, red 2+    23. Sweet Gum 3+    24. Pecan Pollen 2+    25. Pine Mix Negative    26. Walnut, Black Pollen Negative    27. Red Mulberry Negative    28. Ash Mix 3+    29. Birch Mix Negative    30. Beech American Negative    31. Cottonwood, [place] Negative    32. Hickory, White 3+    33. Maple Mix Negative    34. Oak, [place] Mix Negative    35. Sycamore Eastern Negative    36. Alternaria Alternata Negative    37. Cladosporium Herbarum Negative    38. Aspergillus Mix Negative    39. Penicillium Mix Negative    40. Bipolaris Sorokiniana (Helminthosporium) Negative    41. Drechslera Spicifera (Curvularia) Negative    42. Mucor Plumbeus Negative    43. Fusarium Moniliforme Negative    44. Aureobasidium Pullulans (pullulara) Negative    45. Rhizopus Oryzae Negative    46. Botrytis Cinera Negative    47. Epicoccum Nigrum Negative    48. Phoma Betae Negative    49. Dust Mite Mix 4+    50. Cat Hair 10,000 BAU/ml 3+    51.  Dog Epithelia Negative    52. Mixed Feathers Negative    53. Horse Epithelia Negative    54. Cockroach, German 3+    55. Tobacco Leaf Negative              Intradermal - 11/19/22 1407     Time Antigen Placed 1415    Allergen Manufacturer Waynette Buttery    Location Arm    Number of Test 10    Control Negative    Bahia 1+    [place] 1+    Johnson 1+    Ragweed Mix 2+    Mold 1 Negative    Mold 2 2+    Mold 3 2+    Mold 4 3+    Dog  4+             Food Adult Perc - 11/19/22 1400     Time Antigen Placed 1407    5. Milk, Cow Negative    6. Casein Negative             Allergy testing results were read and interpreted by myself, documented by clinical staff.      Malachi Bonds, MD  [facility]

## 2022-11-19 NOTE — Patient Instructions (Addendum)
1. Chronic rhinitis .- Testing today showed: grasses, ragweed, weeds, trees, indoor molds, outdoor molds, dust mites, cat, dog, and cockroach - Copy of test results provided.  - Avoidance measures provided. - Start taking: Zyrtec (cetirizine) 10mg  tablet once daily and Singulair (montelukast) 10mg  daily - You can use an extra dose of the antihistamine, if needed, for breakthrough symptoms.  - Consider nasal saline rinses 1-2 times daily to remove allergens from the nasal cavities as well as help with mucous clearance (this is especially helpful to do before the nasal sprays are given) - Consider allergy shots as a means of long-term control. - Allergy shots "re-train" and "reset" the immune system to ignore environmental allergens and decrease the resulting immune response to those allergens (sneezing, itchy watery eyes, runny nose, nasal congestion, etc).    - Allergy shots improve symptoms in 75-85% of patients.  - We can discuss more at the next appointment if the medications are not working for you.  2. Anaphylactic shock due to food - We did testing that was negative to milk and casein.  - You can introduce this at home.   3. Flexural atopic dermatitis - Hopefully testing will be enlightening for your skin. - Continue with moisturizing twice daily. - Add on triamcinolone 0.1% ointment twice daily as needed for flares (do NOT use on the face).   4. Follow up in 4 months or earlier if needed (try to get it the same day as your son).   Please inform us of any Emergency Department visits, hospitalizations, or changes in symptoms. Call us before going to the ED for breathing or allergy symptoms since we might be able to fit you in for a sick visit. Feel free to contact us anytime with any questions, problems, or concerns.  It was a pleasure to see you again today!  Websites that have reliable patient information: 1. American Academy of Asthma, Allergy, and Immunology: www.aaaai.org 2.  Food Allergy Research and Education (FARE): foodallergy.org 3. Mothers of Asthmatics: http://www.asthmacommunitynetwork.org 4. American College of Allergy, Asthma, and Immunology: www.acaai.org   COVID-19 Vaccine Information can be found at: [email or web address] For questions related to vaccine distribution or appointments, please email [email or web address] or call [phone].   We realize that you might be concerned about having an allergic reaction to the COVID19 vaccines. To help with that concern, WE ARE OFFERING THE COVID19 VACCINES IN OUR OFFICE! Ask the front desk for dates!     "Like" [place] on Facebook and Instagram for our latest updates!      A healthy democracy works best when [employer] participate! Make sure you are registered to vote! If you have moved or changed any of your contact information, you will need to get this updated before voting!  In some cases, you MAY be able to register to vote online: [email or web address]       Airborne Adult Perc - 11/19/22 1351     Time Antigen Placed 1351    Allergen Manufacturer Waynette Buttery    Location Back    Number of Test 55    1. Control-Buffer 50% Glycerol Negative    2. Control-Histamine 2+    3. Bahia Negative    4. [place] Negative    5. Johnson Negative    6. [place] Blue Negative    7. Meadow Fescue 2+    8. Perennial Rye 3+    9. Timothy 2+    10. Ragweed Mix Negative    11. Cocklebur 2+    12.  Plantain,  English 2+    13. Baccharis Negative    14. Dog Fennel Negative    15. Russian Thistle Negative    16. Lamb's Quarters Negative    17. Sheep Sorrell 2+    18. Rough Pigweed 2+    19. Marsh Elder, Rough 2+    20. Mugwort, Common 2+    21. Box, Elder Negative    22. Cedar, red 2+    23. Sweet Gum 3+    24. Pecan Pollen 2+    25. Pine Mix Negative    26. Walnut, Black Pollen Negative    27. Red Mulberry Negative    28.  Ash Mix 3+    29. Birch Mix Negative    30. Beech American Negative    31. Cottonwood, [place] Negative    32. Hickory, White 3+    33. Maple Mix Negative    34. Oak, [place] Mix Negative    35. Sycamore Eastern Negative    36. Alternaria Alternata Negative    37. Cladosporium Herbarum Negative    38. Aspergillus Mix Negative    39. Penicillium Mix Negative    40. Bipolaris Sorokiniana (Helminthosporium) Negative    41. Drechslera Spicifera (Curvularia) Negative    42. Mucor Plumbeus Negative    43. Fusarium Moniliforme Negative    44. Aureobasidium Pullulans (pullulara) Negative    45. Rhizopus Oryzae Negative    46. Botrytis Cinera Negative    47. Epicoccum Nigrum Negative    48. Phoma Betae Negative    49. Dust Mite Mix 4+    50. Cat Hair 10,000 BAU/ml 3+    51.  Dog Epithelia Negative    52. Mixed Feathers Negative    53. Horse Epithelia Negative    54. Cockroach, German 3+    55. Tobacco Leaf Negative             Intradermal - 11/19/22 1407     Time Antigen Placed 1415    Allergen Manufacturer Waynette Buttery    Location Arm    Number of Test 10    Control Negative    Bahia 1+    [place] 1+    Johnson 1+    Ragweed Mix 2+    Mold 1 Negative    Mold 2 2+    Mold 3 2+    Mold 4 3+    Dog 4+             Food Adult Perc - 11/19/22 1400     Time Antigen Placed 1407    5. Milk, Cow Negative    6. Casein Negative             Reducing Pollen Exposure  The American Academy of Allergy, Asthma and Immunology suggests the following steps to reduce your exposure to pollen during allergy seasons.    Do not hang sheets or clothing out to dry; pollen may collect on these items. Do not mow lawns or spend time around freshly cut grass; mowing stirs up pollen. Keep windows closed at night.  Keep car windows closed while driving. Minimize morning activities outdoors, a time when pollen counts are usually at their highest. Stay indoors as much as possible when pollen  counts or humidity is high and on windy days when pollen tends to remain in the air longer. Use air conditioning when possible.  Many air conditioners have filters that trap the pollen spores. Use a HEPA room air filter to remove pollen form  the indoor air you breathe.  Control of Mold Allergen   Mold and fungi can grow on a variety of surfaces provided certain temperature and moisture conditions exist.  Outdoor molds grow on plants, decaying vegetation and soil.  The major outdoor mold, Alternaria and Cladosporium, are found in very high numbers during hot and dry conditions.  Generally, a late Summer - Fall peak is seen for common outdoor fungal spores.  Rain will temporarily lower outdoor mold spore count, but counts rise rapidly when the rainy period ends.  The most important indoor molds are Aspergillus and Penicillium.  Dark, humid and poorly ventilated basements are ideal sites for mold growth.  The next most common sites of mold growth are the bathroom and the kitchen.  Outdoor (Seasonal) Mold Control  Positive outdoor molds via skin testing: Bipolaris (Helminthsporium), Drechslera (Curvalaria), and Mucor  Use air conditioning and keep windows closed Avoid exposure to decaying vegetation. Avoid leaf raking. Avoid grain handling. Consider wearing a face mask if working in moldy areas.    Indoor (Perennial) Mold Control   Positive indoor molds via skin testing: Aspergillus, Penicillium, Fusarium, Aureobasidium (Pullulara), and Rhizopus  Maintain humidity below 50%. Clean washable surfaces with 5% bleach solution. Remove sources e.g. contaminated carpets.    Control of Dog or Cat Allergen  Avoidance is the best way to manage a dog or cat allergy. If you have a dog or cat and are allergic to dog or cats, consider removing the dog or cat from the home. If you have a dog or cat but don't want to find it a new home, or if your family wants a pet even though someone in the household  is allergic, here are some strategies that may help keep symptoms at bay:  Keep the pet out of your bedroom and restrict it to only a few rooms. Be advised that keeping the dog or cat in only one room will not limit the allergens to that room. Don't pet, hug or kiss the dog or cat; if you do, wash your hands with soap and water. High-efficiency particulate air (HEPA) cleaners run continuously in a bedroom or living room can reduce allergen levels over time. Regular use of a high-efficiency vacuum cleaner or a central vacuum can reduce allergen levels. Giving your dog or cat a bath at least once a week can reduce airborne allergen.  Control of Dust Mite Allergen    Dust mites play a major role in allergic asthma and rhinitis.  They occur in environments with high humidity wherever human skin is found.  Dust mites absorb humidity from the atmosphere (ie, they do not drink) and feed on organic matter (including shed human and animal skin).  Dust mites are a microscopic type of insect that you cannot see with the naked eye.  High levels of dust mites have been detected from mattresses, pillows, carpets, upholstered furniture, bed covers, clothes, soft toys and any woven material.  The principal allergen of the dust mite is found in its feces.  A gram of dust may contain 1,000 mites and 250,000 fecal particles.  Mite antigen is easily measured in the air during house cleaning activities.  Dust mites do not bite and do not cause harm to humans, other than by triggering allergies/asthma.    Ways to decrease your exposure to dust mites in your home:  Encase mattresses, box springs and pillows with a mite-impermeable barrier or cover   Wash sheets, blankets and drapes weekly in hot  water (130 F) with detergent and dry them in a dryer on the hot setting.  Have the room cleaned frequently with a vacuum cleaner and a damp dust-mop.  For carpeting or rugs, vacuuming with a vacuum cleaner equipped with a  high-efficiency particulate air (HEPA) filter.  The dust mite allergic individual should not be in a room which is being cleaned and should wait 1 hour after cleaning before going into the room. Do not sleep on upholstered furniture (eg, couches).   If possible removing carpeting, upholstered furniture and drapery from the home is ideal.  Horizontal blinds should be eliminated in the rooms where the person spends the most time (bedroom, study, television room).  Washable vinyl, roller-type shades are optimal. Remove all non-washable stuffed toys from the bedroom.  Wash stuffed toys weekly like sheets and blankets above.   Reduce indoor humidity to less than 50%.  Inexpensive humidity monitors can be purchased at most hardware stores.  Do not use a humidifier as can make the problem worse and are not recommended.  Control of Cockroach Allergen  Cockroach allergen has been identified as an important cause of acute attacks of asthma, especially in urban settings.  There are fifty-five species of cockroach that exist in the [place], however only three, the [place], [place] species produce allergen that can affect patients with Asthma.  Allergens can be obtained from fecal particles, egg casings and secretions from cockroaches.    Remove food sources. Reduce access to water. Seal access and entry points. Spray runways with 0.5-1% Diazinon or Chlorpyrifos Blow boric acid power under stoves and refrigerator. Place bait stations (hydramethylnon) at feeding sites.  Allergy Shots  Allergies are the result of a chain reaction that starts in the immune system. Your immune system controls how your body defends itself. For instance, if you have an allergy to pollen, your immune system identifies pollen as an invader or allergen. Your immune system overreacts by producing antibodies called Immunoglobulin E (IgE). These antibodies travel to cells that release chemicals, causing an allergic  reaction.  The concept behind allergy immunotherapy, whether it is received in the form of shots or tablets, is that the immune system can be desensitized to specific allergens that trigger allergy symptoms. Although it requires time and patience, the payback can be long-term relief. Allergy injections contain a dilute solution of those substances that you are allergic to based upon your skin testing and allergy history.   How Do Allergy Shots Work?  Allergy shots work much like a vaccine. Your body responds to injected amounts of a particular allergen given in increasing doses, eventually developing a resistance and tolerance to it. Allergy shots can lead to decreased, minimal or no allergy symptoms.  There generally are two phases: build-up and maintenance. Build-up often ranges from three to six months and involves receiving injections with increasing amounts of the allergens. The shots are typically given once or twice a week, though more rapid build-up schedules are sometimes used.  The maintenance phase begins when the most effective dose is reached. This dose is different for each person, depending on how allergic you are and your response to the build-up injections. Once the maintenance dose is reached, there are longer periods between injections, typically two to four weeks.  Occasionally doctors give cortisone-type shots that can temporarily reduce allergy symptoms. These types of shots are different and should not be confused with allergy immunotherapy shots.  Who Can Be Treated with Allergy Shots?  Allergy  shots may be a good treatment approach for people with allergic rhinitis (hay fever), allergic asthma, conjunctivitis (eye allergy) or stinging insect allergy.   Before deciding to begin allergy shots, you should consider:   The length of allergy season and the severity of your symptoms  Whether medications and/or changes to your environment can control your symptoms  Your desire  to avoid long-term medication use  Time: allergy immunotherapy requires a major time commitment  Cost: may vary depending on your insurance coverage  Allergy shots for children age 52 and older are effective and often well tolerated. They might prevent the onset of new allergen sensitivities or the progression to asthma.  Allergy shots are not started on patients who are pregnant but can be continued on patients who become pregnant while receiving them. In some patients with other medical conditions or who take certain common medications, allergy shots may be of risk. It is important to mention other medications you talk to your allergist.   What are the two types of build-ups offered:   RUSH or Rapid Desensitization -- one day of injections lasting from 8:30-4:30pm, injections every 1 hour.  Approximately half of the build-up process is completed in that one day.  The following week, normal build-up is resumed, and this entails ~16 visits either weekly or twice weekly, until reaching your "maintenance dose" which is continued weekly until eventually getting spaced out to every month for a duration of 3 to 5 years. The regular build-up appointments are nurse visits where the injections are administered, followed by required monitoring for 30 minutes.    Traditional build-up -- weekly visits for 6 -12 months until reaching "maintenance dose", then continue weekly until eventually spacing out to every 4 weeks as above. At these appointments, the injections are administered, followed by required monitoring for 30 minutes.     Either way is acceptable, and both are equally effective. With the rush protocol, the advantage is that less time is spent here for injections overall AND you would also reach maintenance dosing faster (which is when the clinical benefit starts to become more apparent). Not everyone is a candidate for rapid desensitization.   IF we proceed with the RUSH protocol, there are  premedications which must be taken the day before and the day after the rush only (this includes antihistamines, steroids, and Singulair).  After the rush day, no prednisone or Singulair is required, and we just recommend antihistamines taken on your injection day.  What Is An Estimate of the Costs?  If you are interested in starting allergy injections, please check with your insurance company about your coverage for both allergy vial sets and allergy injections.  Please do so prior to making the appointment to start injections.  The following are CPT codes to give to your insurance company. These are the amounts we BILL to the insurance company, but the amount YOU WILL PAY and WE RECEIVE IS SUBSTANTIALLY LESS and depends on the contracts we have with different insurance companies.   Amount Billed to Insurance One allergy vial set  CPT 95165   $ 1200     Two allergy vial set  CPT 95165   $ 2400     Three allergy vial set  CPT 95165   $ 3600     One injection   CPT 95115   $ 35  Two injections   CPT 95117   $ 40 RUSH (Rapid Desensitization) CPT 95180 x 8 hours $500/hour  Regarding the  allergy injections, your co-pay may or may not apply with each injection, so please confirm this with your insurance company. When you start allergy injections, 1 or 2 sets of vials are made based on your allergies.  Not all patients can be on one set of vials. A set of vials lasts 6 months to a year depending on how quickly you can proceed with your build-up of your allergy injections. Vials are personalized for each patient depending on their specific allergens.  How often are allergy injection given during the build-up period?   Injections are given at least weekly during the build-up period until your maintenance dose is achieved. Per the doctor's discretion, you may have the option of getting allergy injections two times per week during the build-up period. However, there must be at least 48 hours between  injections. The build-up period is usually completed within 6-12 months depending on your ability to schedule injections and for adjustments for reactions. When maintenance dose is reached, your injection schedule is gradually changed to every two weeks and later to every three weeks. Injections will then continue every 4 weeks. Usually, injections are continued for a total of 3-5 years.   When Will I Feel Better?  Some may experience decreased allergy symptoms during the build-up phase. For others, it may take as long as 12 months on the maintenance dose. If there is no improvement after a year of maintenance, your allergist will discuss other treatment options with you.  If you aren't responding to allergy shots, it may be because there is not enough dose of the allergen in your vaccine or there are missing allergens that were not identified during your allergy testing. Other reasons could be that there are high levels of the allergen in your environment or major exposure to non-allergic triggers like tobacco smoke.  What Is the Length of Treatment?  Once the maintenance dose is reached, allergy shots are generally continued for three to five years. The decision to stop should be discussed with your allergist at that time. Some people may experience a permanent reduction of allergy symptoms. Others may relapse and a longer course of allergy shots can be considered.  What Are the Possible Reactions?  The two types of adverse reactions that can occur with allergy shots are local and systemic. Common local reactions include very mild redness and swelling at the injection site, which can happen immediately or several hours after. Report a delayed reaction from your last injection. These include arm swelling or runny nose, watery eyes or cough that occurs within 12-24 hours after injection. A systemic reaction, which is less common, affects the entire body or a particular body system. They are usually  mild and typically respond quickly to medications. Signs include increased allergy symptoms such as sneezing, a stuffy nose or hives.   Rarely, a serious systemic reaction called anaphylaxis can develop. Symptoms include swelling in the throat, wheezing, a feeling of tightness in the chest, nausea or dizziness. Most serious systemic reactions develop within 30 minutes of allergy shots. This is why it is strongly recommended you wait in your doctor's office for 30 minutes after your injections. Your allergist is trained to watch for reactions, and his or her staff is trained and equipped with the proper medications to identify and treat them.   Report to the nurse immediately if you experience any of the following symptoms: swelling, itching or redness of the skin, hives, watery eyes/nose, breathing difficulty, excessive sneezing, coughing, stomach pain,  diarrhea, or light headedness. These symptoms may occur within 15-20 minutes after injection and may require medication.   Who Should Administer Allergy Shots?  The preferred location for receiving shots is your prescribing allergist's office. Injections can sometimes be given at another facility where the physician and staff are trained to recognize and treat reactions, and have received instructions by your prescribing allergist.  What if I am late for an injection?   Injection dose will be adjusted depending upon how many days or weeks you are late for your injection.   What if I am sick?   Please report any illness to the nurse before receiving injections. She may adjust your dose or postpone injections depending on your symptoms. If you have fever, flu, sinus infection or chest congestion it is best to postpone allergy injections until you are better. Never get an allergy injection if your asthma is causing you problems. If your symptoms persist, seek out medical care to get your health problem under control.  What If I am or Become Pregnant:   Women that become pregnant should schedule an appointment with [facility] before receiving any further allergy injections.

## 2022-11-20 ENCOUNTER — Encounter: Payer: Self-pay | Admitting: Allergy & Immunology

## 2022-11-20 MED ORDER — MONTELUKAST SODIUM 10 MG PO TABS
10.0000 mg | ORAL_TABLET | Freq: Every day | ORAL | 5 refills | Status: DC
Start: 2022-11-20 — End: 2023-10-01

## 2022-12-08 ENCOUNTER — Encounter: Payer: Self-pay | Admitting: Family Medicine

## 2022-12-08 ENCOUNTER — Ambulatory Visit (INDEPENDENT_AMBULATORY_CARE_PROVIDER_SITE_OTHER): Payer: 59 | Admitting: Family Medicine

## 2022-12-08 VITALS — BP 120/74 | HR 110 | Ht 68.0 in | Wt 222.0 lb

## 2022-12-08 DIAGNOSIS — Z6833 Body mass index (BMI) 33.0-33.9, adult: Secondary | ICD-10-CM | POA: Diagnosis not present

## 2022-12-08 DIAGNOSIS — E6609 Other obesity due to excess calories: Secondary | ICD-10-CM | POA: Diagnosis not present

## 2022-12-08 DIAGNOSIS — Z713 Dietary counseling and surveillance: Secondary | ICD-10-CM

## 2022-12-08 MED ORDER — PHENTERMINE HCL 37.5 MG PO CAPS
37.5000 mg | ORAL_CAPSULE | ORAL | 1 refills | Status: DC
Start: 2022-12-08 — End: 2023-02-11

## 2022-12-08 NOTE — Progress Notes (Signed)
Patient Office Visit   Subjective   Patient ID: Karina Clayton, female    DOB: 09/12/1985  Age: 37 y.o. MRN: [id number]  CC:  Chief Complaint  Patient presents with   Weight Loss    Patient is here for weight loss f/u. No changes or concerns since last visit.     HPI Karina Clayton 37 year old female, presents to the clinic for weight loss management She  has a past medical history of Abnormal Pap smear, BV (bacterial vaginosis) (04/12/2014), Contraceptive management (05/16/2013), Eczema, Essential hypertension, benign (06/06/2019), Headache(784.0), Obesity (BMI 30.0-34.9) (06/06/2019), PMS (premenstrual syndrome) (06/06/2019), Psoriasis, Screening for STD (sexually transmitted disease) (05/16/2013), Vaginal discharge (04/12/2014), Vaginal Pap smear, abnormal, and Vitamin D deficiency disease (06/06/2019).For the details of today's visit, please refer to assessment and plan.   HPI    Outpatient Encounter Medications as of 12/08/2022  Medication Sig   cetirizine (ZYRTEC) 10 MG tablet Take 1 tablet (10 mg total) by mouth daily.   etonogestrel-ethinyl estradiol (NUVARING) 0.12-0.015 MG/24HR vaginal ring Insert vaginally and leave in place for 3 consecutive weeks, then remove for 1 week.   fluocinolone (SYNALAR) 0.01 % external solution Apply topically 2 (two) times daily.   montelukast (SINGULAIR) 10 MG tablet Take 1 tablet (10 mg total) by mouth at bedtime.   Multiple Vitamin (MULTIVITAMIN) tablet Take 1 tablet by mouth daily.   phentermine 37.5 MG capsule Take 1 capsule (37.5 mg total) by mouth every morning.   triamcinolone ointment (KENALOG) 0.1 % Apply 1 Application topically 2 (two) times daily.   [DISCONTINUED] phentermine 15 MG capsule Take 1 capsule (15 mg total) by mouth every morning.   No facility-administered encounter medications on file as of 12/08/2022.    Past Surgical History:  Procedure Laterality Date   COLPOSCOPY W/ BIOPSY / CURETTAGE     NO PAST SURGERIES       Review of Systems  Constitutional:  Negative for chills and fever.  Respiratory:  Negative for shortness of breath.   Cardiovascular:  Negative for chest pain and palpitations.  Gastrointestinal:  Negative for abdominal pain, nausea and vomiting.  Genitourinary:  Negative for dysuria.  Musculoskeletal:  Negative for myalgias.  Neurological:  Negative for dizziness and headaches.      Objective    BP 120/74   Pulse (!) 110   Ht 5\' 8"  (1.727 m)   Wt 222 lb (100.7 kg)   SpO2 98%   BMI 33.75 kg/m   Physical Exam Vitals reviewed.  Constitutional:      General: She is not in acute distress.    Appearance: Normal appearance. She is not ill-appearing, toxic-appearing or diaphoretic.  HENT:     Head: Normocephalic.  Eyes:     General:        Right eye: No discharge.        Left eye: No discharge.     Conjunctiva/sclera: Conjunctivae normal.  Cardiovascular:     Rate and Rhythm: Normal rate.     Pulses: Normal pulses.     Heart sounds: Normal heart sounds.  Pulmonary:     Effort: Pulmonary effort is normal. No respiratory distress.     Breath sounds: Normal breath sounds.  Musculoskeletal:        General: Normal range of motion.     Cervical back: Normal range of motion.  Skin:    General: Skin is warm and dry.     Capillary Refill: Capillary refill takes less than 2  seconds.  Neurological:     General: No focal deficit present.     Mental Status: She is alert and oriented to person, place, and time.     Coordination: Coordination normal.     Gait: Gait normal.  Psychiatric:        Mood and Affect: Mood normal.        Behavior: Behavior normal.       Assessment & Plan:  Class 1 obesity due to excess calories with serious comorbidity and body mass index (BMI) of 33.0 to 33.9 in adult Assessment & Plan: Patient tolerated well Phentermine 15 mg daily with no side effects Increased Phentermine to 37.5 mg daily Patient loss 3 lbs since last visit Follow up in 8  weeks Discussed the importance to start eating 3 meals a day including breakfast, drink 8 glasses of water a day ,reduce portion sizes. reduced carbohydrates limit saturated and trans fat, increase servings of vegetables and limit processed foods. Find an activity that you will enjoy and start to be active at least 5 days a week for 30 minutes each day. Keep a food journal or an activity journal to identify triggers that lead to emotional eating Follow up in 4 weeks for Weight loss management     Other orders -     Phentermine HCl; Take 1 capsule (37.5 mg total) by mouth every morning.  Dispense: 30 capsule; Refill: 1    Return in about 2 months (around 02/08/2023) for Weight Loss Mangment.   Cruzita Lederer Newman Nip, FNP

## 2022-12-08 NOTE — Patient Instructions (Signed)
        Great to see you today.  I have refilled the medication(s) we provide.    - Please take medications as prescribed. - Follow up with your primary health provider if any health concerns arises. - If symptoms worsen please contact your primary care provider and/or visit the emergency department.  

## 2022-12-08 NOTE — Assessment & Plan Note (Signed)
Patient tolerated well Phentermine 15 mg daily with no side effects Increased Phentermine to 37.5 mg daily Patient loss 3 lbs since last visit Follow up in 8 weeks Discussed the importance to start eating 3 meals a day including breakfast, drink 8 glasses of water a day ,reduce portion sizes. reduced carbohydrates limit saturated and trans fat, increase servings of vegetables and limit processed foods. Find an activity that you will enjoy and start to be active at least 5 days a week for 30 minutes each day. Keep a food journal or an activity journal to identify triggers that lead to emotional eating Follow up in 4 weeks for Weight loss management

## 2023-02-10 NOTE — Progress Notes (Addendum)
Patient Office Visit   Subjective   Patient ID: Karina Clayton, female    DOB: 1985/08/19  Age: 37 y.o. MRN: [id number]  CC:  Chief Complaint  Patient presents with   Weight Loss    F/u   Medication Refill    Phentermene   Immunizations    PT. Declined Flu shot    HPI Karina Clayton 37 year old female, presents to the clinic for weight loss management.She  has a past medical history of Abnormal Pap smear, BV (bacterial vaginosis) (04/12/2014), Contraceptive management (05/16/2013), Eczema, Essential hypertension, benign (06/06/2019), Headache(784.0), Obesity (BMI 30.0-34.9) (06/06/2019), PMS (premenstrual syndrome) (06/06/2019), Psoriasis, Screening for STD (sexually transmitted disease) (05/16/2013), Vaginal discharge (04/12/2014), Vaginal Pap smear, abnormal, and Vitamin D deficiency disease (06/06/2019).For the details of today's visit, please refer to assessment and plan.   HPI    Outpatient Encounter Medications as of 02/11/2023  Medication Sig   cetirizine (ZYRTEC) 10 MG tablet Take 1 tablet (10 mg total) by mouth daily.   Multiple Vitamin (MULTIVITAMIN) tablet Take 1 tablet by mouth daily.   triamcinolone ointment (KENALOG) 0.1 % Apply 1 Application topically 2 (two) times daily.   [DISCONTINUED] phentermine 37.5 MG capsule Take 1 capsule (37.5 mg total) by mouth every morning.   etonogestrel-ethinyl estradiol (NUVARING) 0.12-0.015 MG/24HR vaginal ring Insert vaginally and leave in place for 3 consecutive weeks, then remove for 1 week. (Patient not taking: Reported on 02/11/2023)   fluocinolone (SYNALAR) 0.01 % external solution Apply topically 2 (two) times daily. (Patient not taking: Reported on 02/11/2023)   montelukast (SINGULAIR) 10 MG tablet Take 1 tablet (10 mg total) by mouth at bedtime. (Patient not taking: Reported on 02/11/2023)   phentermine 37.5 MG capsule Take 1 capsule (37.5 mg total) by mouth every morning.   No facility-administered encounter medications on file as  of 02/11/2023.    Past Surgical History:  Procedure Laterality Date   COLPOSCOPY W/ BIOPSY / CURETTAGE     NO PAST SURGERIES      Review of Systems  Constitutional:  Negative for chills and fever.  Eyes:  Negative for blurred vision.  Respiratory:  Negative for shortness of breath.   Cardiovascular:  Negative for chest pain.  Gastrointestinal:  Negative for abdominal pain.  Genitourinary:  Negative for dysuria.  Musculoskeletal:  Negative for myalgias.  Neurological:  Negative for dizziness and headaches.      Objective    BP 138/83 (BP Location: Right Arm, Patient Position: Sitting, Cuff Size: Large)   Pulse 97   Ht 5\' 8"  (1.727 m)   Wt 210 lb 1.3 oz (95.3 kg)   SpO2 96%   BMI 31.94 kg/m   Physical Exam Vitals reviewed.  Constitutional:      General: She is not in acute distress.    Appearance: Normal appearance. She is not ill-appearing, toxic-appearing or diaphoretic.  HENT:     Head: Normocephalic.  Eyes:     General:        Right eye: No discharge.        Left eye: No discharge.     Conjunctiva/sclera: Conjunctivae normal.  Cardiovascular:     Rate and Rhythm: Normal rate.     Pulses: Normal pulses.     Heart sounds: Normal heart sounds.  Pulmonary:     Effort: Pulmonary effort is normal. No respiratory distress.     Breath sounds: Normal breath sounds.  Abdominal:     General: Bowel sounds are normal.  Palpations: Abdomen is soft.     Tenderness: There is no abdominal tenderness. There is no right CVA tenderness, left CVA tenderness or guarding.  Musculoskeletal:        General: Normal range of motion.     Cervical back: Normal range of motion.  Skin:    General: Skin is warm and dry.  Neurological:     General: No focal deficit present.     Mental Status: She is alert.     Coordination: Coordination normal.     Gait: Gait normal.  Psychiatric:        Mood and Affect: Mood normal.        Behavior: Behavior normal.       Assessment & Plan:   Obesity (BMI 30-39.9) -     Phentermine HCl; Take 1 capsule (37.5 mg total) by mouth every morning.  Dispense: 30 capsule; Refill: 2  Class 1 obesity due to excess calories with serious comorbidity and body mass index (BMI) of 31.0 to 31.9 in adult Assessment & Plan: Patient loss 12 lbs since last visit Patient on Phentermine 37.5 mg daily tolerating medication well, with no side effects Continued discussion adhering to weight loss plan, with strong emphasize on nutrition and exercise. Read food labels to know how many calories are in each serving , Increase water intake 2-3 L a day. Include more protein intake such as lean meat, poultry, fish. Increase fiber intake such as vegetables, whole grains, fruits, artichokes, green peas, broccoli, lentils and lima beans.      Return in about 3 months (around 05/13/2023), or if symptoms worsen or fail to improve, for Weight Loss Mangment.   Cruzita Lederer Newman Nip, FNP

## 2023-02-10 NOTE — Patient Instructions (Signed)

## 2023-02-11 ENCOUNTER — Ambulatory Visit: Payer: 59 | Admitting: Family Medicine

## 2023-02-11 ENCOUNTER — Encounter: Payer: Self-pay | Admitting: Family Medicine

## 2023-02-11 VITALS — BP 138/83 | HR 97 | Ht 68.0 in | Wt 210.1 lb

## 2023-02-11 DIAGNOSIS — E669 Obesity, unspecified: Secondary | ICD-10-CM

## 2023-02-11 DIAGNOSIS — E6609 Other obesity due to excess calories: Secondary | ICD-10-CM | POA: Diagnosis not present

## 2023-02-11 DIAGNOSIS — Z6831 Body mass index (BMI) 31.0-31.9, adult: Secondary | ICD-10-CM | POA: Diagnosis not present

## 2023-02-11 MED ORDER — PHENTERMINE HCL 37.5 MG PO CAPS
37.5000 mg | ORAL_CAPSULE | ORAL | 2 refills | Status: AC
Start: 2023-02-11 — End: ?

## 2023-02-11 NOTE — Assessment & Plan Note (Addendum)
Patient loss 12 lbs since last visit Patient on Phentermine 37.5 mg daily tolerating medication well, with no side effects Continued discussion adhering to weight loss plan, with strong emphasize on nutrition and exercise. Read food labels to know how many calories are in each serving , Increase water intake 2-3 L a day. Include more protein intake such as lean meat, poultry, fish. Increase fiber intake such as vegetables, whole grains, fruits, artichokes, green peas, broccoli, lentils and lima beans.

## 2023-04-22 ENCOUNTER — Ambulatory Visit (INDEPENDENT_AMBULATORY_CARE_PROVIDER_SITE_OTHER): Payer: 59 | Admitting: Allergy & Immunology

## 2023-04-22 ENCOUNTER — Encounter: Payer: Self-pay | Admitting: Allergy & Immunology

## 2023-04-22 VITALS — BP 132/88 | HR 103 | Temp 98.2°F | Resp 18 | Ht 66.5 in | Wt 204.5 lb

## 2023-04-22 DIAGNOSIS — J3089 Other allergic rhinitis: Secondary | ICD-10-CM

## 2023-04-22 DIAGNOSIS — L2089 Other atopic dermatitis: Secondary | ICD-10-CM | POA: Diagnosis not present

## 2023-04-22 DIAGNOSIS — T7800XD Anaphylactic reaction due to unspecified food, subsequent encounter: Secondary | ICD-10-CM | POA: Diagnosis not present

## 2023-04-22 DIAGNOSIS — J302 Other seasonal allergic rhinitis: Secondary | ICD-10-CM | POA: Diagnosis not present

## 2023-04-22 NOTE — Patient Instructions (Signed)
1. Chronic rhinitis .- Previous testing showed: grasses, ragweed, weeds, trees, indoor molds, outdoor molds, dust mites, cat, dog, and cockroach - Continue taking: Zyrtec (cetirizine) 10mg  tablet once daily and Singulair (montelukast) 10mg  daily - You can use an extra dose of the antihistamine, if needed, for breakthrough symptoms.  - Consider nasal saline rinses 1-2 times daily to remove allergens from the nasal cavities as well as help with mucous clearance (this is especially helpful to do before the nasal sprays are given) - It seems that we can avoid allergy shots at this point in time.   2. Anaphylactic shock due to food - Continue to keep milk in your diet since testing was negative.   3. Flexural atopic dermatitis - Continue with moisturizing twice daily. - Continue with triamcinolone 0.1% ointment twice daily as needed for flares (do NOT use on the face).   4. Return if symptoms worsen or fail to improve. We are always happy to see you if needed, but I do not want to waste your time! :-)    Please inform us of any Emergency Department visits, hospitalizations, or changes in symptoms. Call us before going to the ED for breathing or allergy symptoms since we might be able to fit you in for a sick visit. Feel free to contact us anytime with any questions, problems, or concerns.  It was a pleasure to see you again today!  Websites that have reliable patient information: 1. American Academy of Asthma, Allergy, and Immunology: www.aaaai.org 2. Food Allergy Research and Education (FARE): foodallergy.org 3. Mothers of Asthmatics: http://www.asthmacommunitynetwork.org 4. American College of Allergy, Asthma, and Immunology: www.acaai.org      "Like" [place] on Facebook and Instagram for our latest updates!      A healthy democracy works best when [employer] participate! Make sure you are registered to vote! If you have moved or changed any of your contact information, you will need to  get this updated before voting! Scan the QR codes below to learn more!

## 2023-04-22 NOTE — Progress Notes (Signed)
FOLLOW UP  Date of Service/Encounter:  04/22/23   Assessment:   Seasonal and perennial allergic rhinitis (grasses, ragweed, weeds, trees, indoor molds, outdoor molds, dust mites, cat, dog, and cockroach)  Adverse food reaction (milk) - with negative testing  Atopic dermatitis   Plan/Recommendations:   1. Chronic rhinitis .- Previous testing showed: grasses, ragweed, weeds, trees, indoor molds, outdoor molds, dust mites, cat, dog, and cockroach - Continue taking: Zyrtec (cetirizine) 10mg  tablet once daily and Singulair (montelukast) 10mg  daily - You can use an extra dose of the antihistamine, if needed, for breakthrough symptoms.  - Consider nasal saline rinses 1-2 times daily to remove allergens from the nasal cavities as well as help with mucous clearance (this is especially helpful to do before the nasal sprays are given) - It seems that we can avoid allergy shots at this point in time.   2. Anaphylactic shock due to food - Continue to keep milk in your diet since testing was negative.   3. Flexural atopic dermatitis - Continue with moisturizing twice daily. - Continue with triamcinolone 0.1% ointment twice daily as needed for flares (do NOT use on the face).   4. Return if symptoms worsen or fail to improve. We are always happy to see you if needed, but I do not want to waste your time! :-)    Subjective:   Karina Clayton is a 37 y.o. female presenting today for follow up of  Chief Complaint  Patient presents with   Follow-up    Karina Clayton has a history of the following: Patient Active Problem List   Diagnosis Date Noted   Left leg pain 08/13/2022   Encounter for surveillance of vaginal ring hormonal contraceptive device 06/19/2021   Abnormal urine odor 06/19/2021   Urinary frequency 06/19/2021   Urinary urgency 06/19/2021   Essential hypertension, benign 06/06/2019   Vitamin D deficiency disease 06/06/2019   PMS (premenstrual syndrome) 06/06/2019    Class 1 obesity with serious comorbidity in adult 06/06/2019   BV (bacterial vaginosis) 04/12/2014   Migraine headache with aura 08/21/2012   Psoriasis 08/21/2012    History obtained from: chart review and patient.  Discussed the use of AI scribe software for clinical note transcription with the patient and/or guardian, who gave verbal consent to proceed.  Karina Clayton is a 37 y.o. female presenting for a follow up visit.  She was last seen in June 2024.  At that time, she had testing that was positive to multiple indoor and outdoor allergens.  Started Zyrtec 10 mg daily and Singulair 10 mg daily.  We did talk about allergy shots for long-term control.  She had testing that was negative to milk and casein.  Atopic dermatitis was pretty controlled with moisturizing.  We did add on triamcinolone 0.1% ointment to use twice daily as needed for flares.  Since the last visit, she has done very well.   The patient, with a history of allergies and asthma, reports no recent issues with allergies. She mentions occasional tearing, but no other symptoms. She has been using Symbicort for asthma management, which she takes before school. She also participates in sports, with practices typically held in the evening. The patient has not reported any infections or other health issues. She has been compliant with her current medication regimen and has not started any new medications.   She does not think that she needs allergy shots at this point. Her skins is under good control with the current set of  medications. She has not been using her triamcinolone at all. She does ingest milk products without a problem.    Otherwise, there have been no changes to her past medical history, surgical history, family history, or social history.    Review of systems otherwise negative other than that mentioned in the HPI.    Objective:   Blood pressure 132/88, pulse (!) 103, temperature 98.2 F (36.8 C), resp. rate 18,  height 5' 6.5" (1.689 m), weight 204 lb 8 oz (92.8 kg), SpO2 98%. Body mass index is 32.51 kg/m.    Physical Exam Vitals reviewed.  Constitutional:      Appearance: She is well-developed.  HENT:     Head: Normocephalic and atraumatic.     Right Ear: Tympanic membrane, ear canal and external ear normal. No drainage, swelling or tenderness. Tympanic membrane is not injected, scarred, erythematous, retracted or bulging.     Left Ear: Tympanic membrane, ear canal and external ear normal. No drainage, swelling or tenderness. Tympanic membrane is not injected, scarred, erythematous, retracted or bulging.     Nose: Mucosal edema and rhinorrhea present. No nasal deformity or septal deviation.     Right Turbinates: Enlarged, swollen and pale.     Left Turbinates: Enlarged, swollen and pale.     Right Sinus: No maxillary sinus tenderness or frontal sinus tenderness.     Left Sinus: No maxillary sinus tenderness or frontal sinus tenderness.     Comments: No nasal polyps noted.     Mouth/Throat:     Mouth: Mucous membranes are not pale and not dry.     Pharynx: Uvula midline.  Eyes:     General: Lids are normal. Allergic shiner present.        Right eye: No discharge.        Left eye: No discharge.     Conjunctiva/sclera: Conjunctivae normal.     Right eye: Right conjunctiva is not injected. No chemosis.    Left eye: Left conjunctiva is not injected. No chemosis.    Pupils: Pupils are equal, round, and reactive to light.  Cardiovascular:     Rate and Rhythm: Normal rate and regular rhythm.     Heart sounds: Normal heart sounds.  Pulmonary:     Effort: Pulmonary effort is normal. No tachypnea, accessory muscle usage or respiratory distress.     Breath sounds: Normal breath sounds. No wheezing, rhonchi or rales.  Chest:     Chest wall: No tenderness.  Abdominal:     Tenderness: There is no abdominal tenderness. There is no guarding or rebound.  Lymphadenopathy:     Head:     Right side  of head: No submandibular, tonsillar or occipital adenopathy.     Left side of head: No submandibular, tonsillar or occipital adenopathy.     Cervical: No cervical adenopathy.  Skin:    Coloration: Skin is not pale.     Findings: No abrasion, erythema, petechiae or rash. Rash is not papular, urticarial or vesicular.  Neurological:     Mental Status: She is alert.  Psychiatric:        Behavior: Behavior is cooperative.      Diagnostic studies: none      Karina Bonds, MD  [facility]

## 2023-05-12 NOTE — Progress Notes (Unsigned)
   Established Patient Office Visit   Subjective  Patient ID: Karina Clayton, female    DOB: Oct 15, 1985  Age: 37 y.o. MRN: [id number]  No chief complaint on file.   She  has a past medical history of Abnormal Pap smear, BV (bacterial vaginosis) (04/12/2014), Contraceptive management (05/16/2013), Eczema, Essential hypertension, benign (06/06/2019), Headache(784.0), Obesity (BMI 30.0-34.9) (06/06/2019), PMS (premenstrual syndrome) (06/06/2019), Psoriasis, Screening for STD (sexually transmitted disease) (05/16/2013), Vaginal discharge (04/12/2014), Vaginal Pap smear, abnormal, and Vitamin D deficiency disease (06/06/2019).  HPI  ROS    Objective:     There were no vitals taken for this visit. {Vitals History (Optional):23777}  Physical Exam   No results found for any visits on 05/13/23.  The ASCVD Risk score (Arnett DK, et al., 2019) failed to calculate for the following reasons:   The 2019 ASCVD risk score is only valid for ages 67 to 67    Assessment & Plan:  There are no diagnoses linked to this encounter.  No follow-ups on file.   Cruzita Lederer Newman Nip, FNP

## 2023-05-12 NOTE — Patient Instructions (Signed)

## 2023-05-13 ENCOUNTER — Encounter: Payer: Self-pay | Admitting: Family Medicine

## 2023-05-13 ENCOUNTER — Ambulatory Visit (INDEPENDENT_AMBULATORY_CARE_PROVIDER_SITE_OTHER): Payer: 59 | Admitting: Family Medicine

## 2023-05-13 VITALS — BP 127/76 | HR 81 | Ht 66.5 in | Wt 203.1 lb

## 2023-05-13 DIAGNOSIS — Z6832 Body mass index (BMI) 32.0-32.9, adult: Secondary | ICD-10-CM

## 2023-05-13 DIAGNOSIS — E6609 Other obesity due to excess calories: Secondary | ICD-10-CM

## 2023-05-13 NOTE — Assessment & Plan Note (Signed)
Patient loss 20 lbs on phentermine x 6 months Discontinued phentermine therapy and advise to continue with lifestyle changes Discussed the importance to start eating 3 meals a day including breakfast, drink 8 glasses of water a day ,reduce portion sizes. reduced carbohydrates limit saturated and trans fat, increase servings of vegetables and limit processed foods. Find an activity that you will enjoy and start to be active at least 5 days a week for 30 minutes each day. Keep a food journal or an activity journal to identify triggers that lead to emotional eating

## 2023-08-12 ENCOUNTER — Ambulatory Visit: Payer: 59 | Admitting: Family Medicine

## 2023-10-01 ENCOUNTER — Ambulatory Visit (INDEPENDENT_AMBULATORY_CARE_PROVIDER_SITE_OTHER): Admitting: Family Medicine

## 2023-10-01 VITALS — BP 127/84 | HR 78 | Ht 66.5 in | Wt 207.1 lb

## 2023-10-01 DIAGNOSIS — Z0001 Encounter for general adult medical examination with abnormal findings: Secondary | ICD-10-CM | POA: Diagnosis not present

## 2023-10-01 DIAGNOSIS — E038 Other specified hypothyroidism: Secondary | ICD-10-CM

## 2023-10-01 DIAGNOSIS — R7303 Prediabetes: Secondary | ICD-10-CM

## 2023-10-01 DIAGNOSIS — E782 Mixed hyperlipidemia: Secondary | ICD-10-CM

## 2023-10-01 DIAGNOSIS — E559 Vitamin D deficiency, unspecified: Secondary | ICD-10-CM | POA: Diagnosis not present

## 2023-10-01 NOTE — Progress Notes (Deleted)
   Established Patient Office Visit   Subjective  Patient ID: Karina Clayton, female    DOB: May 16, 1986  Age: 38 y.o. MRN: [id number]  No chief complaint on file.   She  has a past medical history of Abnormal Pap smear, BV (bacterial vaginosis) (04/12/2014), Contraceptive management (05/16/2013), Eczema, Essential hypertension, benign (06/06/2019), Headache(784.0), Obesity (BMI 30.0-34.9) (06/06/2019), PMS (premenstrual syndrome) (06/06/2019), Psoriasis, Screening for STD (sexually transmitted disease) (05/16/2013), Vaginal discharge (04/12/2014), Vaginal Pap smear, abnormal, and Vitamin D  deficiency disease (06/06/2019).  HPI  ROS    Objective:     There were no vitals taken for this visit. {Vitals History (Optional):23777}  Physical Exam   No results found for any visits on 10/01/23.  The ASCVD Risk score (Arnett DK, et al., 2019) failed to calculate for the following reasons:   The 2019 ASCVD risk score is only valid for ages 22 to 102    Assessment & Plan:  There are no diagnoses linked to this encounter.  No follow-ups on file.   Avelino Lek Amber Bail, FNP

## 2023-10-01 NOTE — Progress Notes (Signed)
 Complete physical exam  Patient: Karina Clayton   DOB: December 03, 1985   38 y.o. Female  MRN: [id number]  Subjective:    Chief Complaint  Patient presents with   Medical Management of Chronic Issues    3 Month f/u pre-diabetes, hyperlipidemia.    Karina Clayton is a 38 y.o. female who presents today for a complete physical exam. She reports consuming a general diet.  Walking 3 times per week  She generally feels well. She reports sleeping well. She does have additional problems to discuss today.    Most recent fall risk assessment:    10/01/2023    1:51 PM  Fall Risk   Falls in the past year? 0  Number falls in past yr: 0  Injury with Fall? 0  Risk for fall due to : No Fall Risks  Follow up Falls evaluation completed     Most recent depression screenings:    10/01/2023    1:51 PM 05/13/2023    8:16 AM  PHQ 2/9 Scores  PHQ - 2 Score 0 0    Vision:Within last year and Dental: No current dental problems and Receives regular dental care  Patient Care Team: Del Amber Bail, Rogerio Clay, FNP as PCP - General (Family Medicine)   Outpatient Medications Prior to Visit  Medication Sig   Multiple Vitamin (MULTIVITAMIN) tablet Take 1 tablet by mouth daily.   triamcinolone  ointment (KENALOG ) 0.1 % Apply 1 Application topically 2 (two) times daily.   phentermine  37.5 MG capsule Take 1 capsule (37.5 mg total) by mouth every morning. (Patient not taking: Reported on 10/01/2023)   [DISCONTINUED] cetirizine  (ZYRTEC ) 10 MG tablet Take 1 tablet (10 mg total) by mouth daily. (Patient not taking: Reported on 04/22/2023)   [DISCONTINUED] montelukast  (SINGULAIR ) 10 MG tablet Take 1 tablet (10 mg total) by mouth at bedtime. (Patient not taking: Reported on 02/11/2023)   No facility-administered medications prior to visit.    Review of Systems  Constitutional:  Negative for chills and fever.  Eyes:  Negative for blurred vision.  Respiratory:  Negative for shortness of breath.   Cardiovascular:   Negative for chest pain.  Gastrointestinal:  Negative for abdominal pain.  Neurological:  Negative for dizziness and headaches.       Objective:    BP 127/84   Pulse 78   Ht 5' 6.5" (1.689 m)   Wt 207 lb 1.9 oz (93.9 kg)   LMP 09/19/2023 (Exact Date)   SpO2 99%   BMI 32.93 kg/m  BP Readings from Last 3 Encounters:  10/01/23 127/84  05/13/23 127/76  04/22/23 132/88      Physical Exam Vitals reviewed.  Constitutional:      General: She is not in acute distress.    Appearance: Normal appearance. She is not ill-appearing, toxic-appearing or diaphoretic.  HENT:     Head: Normocephalic.     Right Ear: Tympanic membrane normal.     Left Ear: Tympanic membrane normal.     Nose: Nose normal.     Mouth/Throat:     Mouth: Mucous membranes are moist.     Pharynx: No posterior oropharyngeal erythema.  Eyes:     General:        Right eye: No discharge.        Left eye: No discharge.     Conjunctiva/sclera: Conjunctivae normal.     Pupils: Pupils are equal, round, and reactive to light.  Cardiovascular:     Rate and Rhythm: Normal rate.  Pulses: Normal pulses.     Heart sounds: Normal heart sounds.  Pulmonary:     Effort: Pulmonary effort is normal. No respiratory distress.     Breath sounds: Normal breath sounds.  Abdominal:     General: Bowel sounds are normal.     Palpations: Abdomen is soft.     Tenderness: There is no abdominal tenderness. There is no right CVA tenderness, left CVA tenderness or guarding.  Musculoskeletal:        General: Normal range of motion.     Cervical back: Normal range of motion.  Skin:    General: Skin is warm and dry.     Capillary Refill: Capillary refill takes less than 2 seconds.  Neurological:     General: No focal deficit present.     Mental Status: She is alert and oriented to person, place, and time.     Coordination: Coordination normal.     Gait: Gait normal.  Psychiatric:        Mood and Affect: Mood normal.         Behavior: Behavior normal.        Thought Content: Thought content normal.      No results found for any visits on 10/01/23.    Assessment & Plan:    Routine Health Maintenance and Physical Exam  Immunization History  Administered Date(s) Administered   Influenza Whole 03/10/2012   Tdap 08/11/2015    Health Maintenance  Topic Date Due   COVID-19 Vaccine (1 - 2024-25 season) 10/17/2023 (Originally 02/08/2023)   INFLUENZA VACCINE  01/08/2024   DTaP/Tdap/Td (2 - Td or Tdap) 08/10/2025   Cervical Cancer Screening (HPV/Pap Cotest)  03/19/2026   Hepatitis C Screening  Completed   HIV Screening  Completed   HPV VACCINES  Aged Out   Meningococcal B Vaccine  Aged Out    Discussed health benefits of physical activity, and encouraged her to engage in regular exercise appropriate for her age and condition.  Prediabetes -     Hemoglobin A1c  Vitamin D  deficiency -     VITAMIN D  25 Hydroxy (Vit-D Deficiency, Fractures)  TSH (thyroid-stimulating hormone deficiency) -     TSH + free T4  Mixed hyperlipidemia -     Lipid panel -     CMP14+EGFR -     CBC with Differential/Platelet  Encounter for routine adult physical exam with abnormal findings Assessment & Plan: A comprehensive physical examination was completed, and necessary labs were ordered. Screening and health maintenance recommendations have been updated. The patient received counseling on exercise and nutrition. BMI was assessed and discussed Advise for heart health, focus on: Eat more fruits and vegetables: Aim for a variety of colors. Choose whole grains: Brown rice, oats, and whole-wheat bread. Limit unhealthy fats: Avoid trans fats; use olive or avocado oil instead. Include lean proteins: Opt for fish, chicken, beans, and legumes. Reduce sodium: Limit processed foods and add less salt. Stay hydrated: Drink plenty of water. Exercise regularly: Aim for at least 30 minutes of moderate exercise, like walking or cycling, 5  days a week.       Return in about 1 year (around 09/30/2024), or if symptoms worsen or fail to improve, for Annual Physical.     Avelino Lek Amber Bail, FNP

## 2023-10-01 NOTE — Assessment & Plan Note (Signed)

## 2023-10-01 NOTE — Patient Instructions (Signed)

## 2023-10-02 ENCOUNTER — Encounter: Payer: Self-pay | Admitting: Family Medicine

## 2023-10-02 LAB — CMP14+EGFR
ALT: 18 IU/L (ref 0–32)
AST: 17 IU/L (ref 0–40)
Albumin: 4.4 g/dL (ref 3.9–4.9)
Alkaline Phosphatase: 84 IU/L (ref 44–121)
BUN/Creatinine Ratio: 11 (ref 9–23)
BUN: 10 mg/dL (ref 6–20)
Bilirubin Total: 0.2 mg/dL (ref 0.0–1.2)
CO2: 24 mmol/L (ref 20–29)
Calcium: 9.7 mg/dL (ref 8.7–10.2)
Chloride: 103 mmol/L (ref 96–106)
Creatinine, Ser: 0.92 mg/dL (ref 0.57–1.00)
Globulin, Total: 2.2 g/dL (ref 1.5–4.5)
Glucose: 75 mg/dL (ref 70–99)
Potassium: 4.4 mmol/L (ref 3.5–5.2)
Sodium: 139 mmol/L (ref 134–144)
Total Protein: 6.6 g/dL (ref 6.0–8.5)
eGFR: 83 mL/min/{1.73_m2} (ref >59)

## 2023-10-02 LAB — CBC WITH DIFFERENTIAL/PLATELET
Basophils Absolute: 0 10*3/uL (ref 0.0–0.2)
Basos: 1 %
EOS (ABSOLUTE): 0.1 10*3/uL (ref 0.0–0.4)
Eos: 2 %
Hematocrit: 41.7 % (ref 34.0–46.6)
Hemoglobin: 14.1 g/dL (ref 11.1–15.9)
Immature Grans (Abs): 0 10*3/uL (ref 0.0–0.1)
Immature Granulocytes: 0 %
Lymphocytes Absolute: 2.7 10*3/uL (ref 0.7–3.1)
Lymphs: 57 %
MCH: 31.6 pg (ref 26.6–33.0)
MCHC: 33.8 g/dL (ref 31.5–35.7)
MCV: 94 fL (ref 79–97)
Monocytes Absolute: 0.3 10*3/uL (ref 0.1–0.9)
Monocytes: 6 %
Neutrophils Absolute: 1.6 10*3/uL (ref 1.4–7.0)
Neutrophils: 34 %
Platelets: 243 10*3/uL (ref 150–450)
RBC: 4.46 x10E6/uL (ref 3.77–5.28)
RDW: 12.7 % (ref 11.7–15.4)
WBC: 4.7 10*3/uL (ref 3.4–10.8)

## 2023-10-02 LAB — VITAMIN D 25 HYDROXY (VIT D DEFICIENCY, FRACTURES): Vit D, 25-Hydroxy: 52.6 ng/mL (ref 30.0–100.0)

## 2023-10-02 LAB — HEMOGLOBIN A1C
Est. average glucose Bld gHb Est-mCnc: 105 mg/dL
Hgb A1c MFr Bld: 5.3 % (ref 4.8–5.6)

## 2023-10-02 LAB — LIPID PANEL
Chol/HDL Ratio: 2.9 ratio (ref 0.0–4.4)
Cholesterol, Total: 163 mg/dL (ref 100–199)
HDL: 57 mg/dL (ref >39)
LDL Chol Calc (NIH): 93 mg/dL (ref 0–99)
Triglycerides: 67 mg/dL (ref 0–149)
VLDL Cholesterol Cal: 13 mg/dL (ref 5–40)

## 2023-10-02 LAB — TSH+FREE T4
Free T4: 1.09 ng/dL (ref 0.82–1.77)
TSH: 1.05 u[IU]/mL (ref 0.450–4.500)

## 2024-06-15 ENCOUNTER — Encounter: Admitting: Nurse Practitioner

## 2024-06-15 ENCOUNTER — Encounter: Payer: Self-pay | Admitting: Nurse Practitioner

## 2024-06-15 ENCOUNTER — Ambulatory Visit: Admitting: Nurse Practitioner

## 2024-06-15 VITALS — BP 129/89 | HR 92 | Ht 68.0 in | Wt 227.0 lb

## 2024-06-15 DIAGNOSIS — J039 Acute tonsillitis, unspecified: Secondary | ICD-10-CM | POA: Diagnosis not present

## 2024-06-15 MED ORDER — AZITHROMYCIN 250 MG PO TABS: ORAL_TABLET | ORAL | 0 refills | Status: DC

## 2024-06-15 NOTE — Progress Notes (Signed)
 "  Subjective:    Patient ID: Karina Clayton, female    DOB: 1986/03/05, 39 y.o.   MRN: [id number]   Chief Complaint: Sore Throat   Sore Throat  This is a new problem. The current episode started 1 to 4 weeks ago. The problem has been gradually improving. Neither side of throat is experiencing more pain than the other. There has been no fever. The pain is at a severity of 3/10. The pain is mild. Associated symptoms include coughing and trouble swallowing. Pertinent negatives include no congestion, ear pain or swollen glands. She has had no exposure to strep. She has tried nothing for the symptoms. The treatment provided no relief.    Patient Active Problem List   Diagnosis Date Noted   Encounter for routine adult physical exam with abnormal findings 10/01/2023   Left leg pain 08/13/2022   Encounter for surveillance of vaginal ring hormonal contraceptive device 06/19/2021   Abnormal urine odor 06/19/2021   Urinary frequency 06/19/2021   Urinary urgency 06/19/2021   Essential hypertension, benign 06/06/2019   Vitamin D  deficiency disease 06/06/2019   PMS (premenstrual syndrome) 06/06/2019   Class 1 obesity with serious comorbidity in adult 06/06/2019   BV (bacterial vaginosis) 04/12/2014   Migraine headache with aura 08/21/2012   Psoriasis 08/21/2012       Review of Systems  Constitutional:  Negative for chills and fever.  HENT:  Positive for trouble swallowing. Negative for congestion and ear pain.   Respiratory:  Positive for cough.        Objective:   Physical Exam Constitutional:      Appearance: She is well-developed.  HENT:     Right Ear: Tympanic membrane normal.     Left Ear: Tympanic membrane normal.     Nose: No congestion or rhinorrhea.     Mouth/Throat:     Tonsils: No tonsillar exudate or tonsillar abscesses.     Comments: Mild tonsillar edema bil Cardiovascular:     Rate and Rhythm: Normal rate and regular rhythm.     Heart sounds: Normal heart sounds.   Pulmonary:     Breath sounds: Normal breath sounds.  Musculoskeletal:     Cervical back: Normal range of motion.  Lymphadenopathy:     Cervical: Cervical adenopathy (tonsillar bil) present.  Skin:    General: Skin is warm.  Neurological:     General: No focal deficit present.     Mental Status: She is alert and oriented to person, place, and time.  Psychiatric:        Mood and Affect: Mood normal.        Behavior: Behavior normal.     BP 129/89   Pulse 92   Ht 5' 8 (1.727 m)   Wt 227 lb (103 kg)   SpO2 99%   BMI 34.52 kg/m        Assessment & Plan:  JAKYLA REZA in today with chief complaint of Sore Throat   1. Tonsillitis (Primary) Force fluids Motrin  or tylenol  OTC OTC decongestant Throat lozenges if help New toothbrush in 3 days  - azithromycin  (ZITHROMAX  Z-PAK) 250 MG tablet; As directed  Dispense: 6 tablet; Refill: 0    The above assessment and management plan was discussed with the patient. The patient verbalized understanding of and has agreed to the management plan. Patient is aware to call the clinic if symptoms persist or worsen. Patient is aware when to return to the clinic for a follow-up visit. Patient educated  on when it is appropriate to go to the emergency department.   Mary-Margaret Gladis, FNP    "

## 2024-06-15 NOTE — Patient Instructions (Signed)
Force fluids °Motrin or tylenol OTC °OTC decongestant °Throat lozenges if help °New toothbrush in 3 days ° °

## 2024-06-17 ENCOUNTER — Telehealth: Payer: Self-pay

## 2024-06-17 ENCOUNTER — Other Ambulatory Visit: Payer: Self-pay

## 2024-06-17 DIAGNOSIS — J039 Acute tonsillitis, unspecified: Secondary | ICD-10-CM

## 2024-06-17 MED ORDER — AZITHROMYCIN 250 MG PO TABS: ORAL_TABLET | ORAL | 0 refills | Status: AC

## 2024-06-17 NOTE — Telephone Encounter (Signed)
 Resent

## 2024-06-17 NOTE — Telephone Encounter (Signed)
 Copied from CRM [id number]. Topic: Clinical - Prescription Issue >> Jun 17, 2024  3:50 PM Karina Clayton wrote: Reason for CRM: azithromycin  (ZITHROMAX  Z-PAK) 250 MG tablet  Pt called reporting that her pharmacy never received this because their system was down. However it is back now  [id number] - [place], [place] - [address] AT [address], [place] [address] Phone: [phone]  Fax: [phone]  She is requesting to have this resubmitted now so she can get it.
